# Patient Record
Sex: Female | Born: 2000 | Hispanic: Yes | Marital: Single | Smoking: Never smoker
Health system: Southern US, Community
[De-identification: ages and names within clinical notes are randomized; demographics above are authoritative.]

## PROBLEM LIST (undated history)

## (undated) DIAGNOSIS — K219 Gastro-esophageal reflux disease without esophagitis: Secondary | ICD-10-CM

---
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
## 2020-02-08 ENCOUNTER — Ambulatory Visit (INDEPENDENT_AMBULATORY_CARE_PROVIDER_SITE_OTHER): Payer: Medicaid Other | Admitting: Nurse Practitioner

## 2020-02-08 ENCOUNTER — Other Ambulatory Visit: Payer: Self-pay

## 2020-02-08 ENCOUNTER — Encounter: Payer: Self-pay | Admitting: Nurse Practitioner

## 2020-02-08 VITALS — BP 96/60 | HR 73 | Temp 98.1°F | Resp 18 | Wt 108.0 lb

## 2020-02-08 DIAGNOSIS — Z309 Encounter for contraceptive management, unspecified: Secondary | ICD-10-CM

## 2020-02-08 MED ORDER — MEDROXYPROGESTERONE ACETATE 150 MG/ML IM SUSP: 150.0000 mg | Freq: Once | INTRAMUSCULAR | 0 refills | Status: DC

## 2020-02-08 NOTE — Progress Notes (Signed)
Established Patient Office Visit  Subjective:  Patient ID: Kathleen Miles, female    DOB: 2001/04/20  Age: 19 y.o. MRN: [id number]  CC:  Chief Complaint  Patient presents with  . Contraception    HPI Kathleen Miles is a 19 year old female presenting to discuss birth control. She desires to start Depo Provera shots. Was on birth control however she stopped taking. She did have unprotected sex today.   History reviewed. No pertinent past medical history.  History reviewed. No pertinent surgical history.  Family History  Problem Relation Age of Onset  . HIV Father     Social History   Socioeconomic History  . Marital status: Single    Spouse name: Not on file  . Number of children: Not on file  . Years of education: Not on file  . Highest education level: Not on file  Occupational History  . Not on file  Tobacco Use  . Smoking status: Never Smoker  . Smokeless tobacco: Never Used  Substance and Sexual Activity  . Alcohol use: No  . Drug use: No  . Sexual activity: Never  Other Topics Concern  . Not on file  Social History Narrative   Lives with mother and younger brother Charlcie Cradle.  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  Wants to play basketball and run track.   Social Determinants of Health   Financial Resource Strain:   . Difficulty of Paying Living Expenses:   Food Insecurity:   . Worried About Programme researcher, broadcasting/film/video in the Last Year:   . Barista in the Last Year:   Transportation Needs:   . Freight forwarder (Medical):   Marland Kitchen Lack of Transportation (Non-Medical):   Physical Activity:   . Days of Exercise per Week:   . Minutes of Exercise per Session:   Stress:   . Feeling of Stress :   Social Connections:   . Frequency of Communication with Friends and Family:   . Frequency of Social Gatherings with Friends and Family:   . Attends Religious Services:   . Active Member of Clubs or Organizations:   . Attends Banker Meetings:   Marland Kitchen  Marital Status:   Intimate Partner Violence:   . Fear of Current or Ex-Partner:   . Emotionally Abused:   Marland Kitchen Physically Abused:   . Sexually Abused:     Outpatient Medications Prior to Visit  Medication Sig Dispense Refill  . adapalene (DIFFERIN) 0.1 % cream APLIQUE AL AREA AFECTADA TODOS LOS DIAS AL ACOSTARSE 45 g 3  . doxycycline (VIBRAMYCIN) 50 MG capsule TOME UNA CAPSULA DOS VECES AL DIA (Patient not taking: Reported on 06/17/2018) 60 capsule 3  . medroxyPROGESTERone (PROVERA) 10 MG tablet Take 1 tablet (10 mg total) by mouth daily. (Patient not taking: Reported on 06/17/2018) 10 tablet 0  . norgestrel-ethinyl estradiol (LO/OVRAL,CRYSELLE) 0.3-30 MG-MCG tablet Take 1 tablet by mouth daily. (Patient not taking: Reported on 06/17/2018) 1 Package 11  . omeprazole (PRILOSEC) 20 MG capsule Take 1 capsule (20 mg total) by mouth daily. 30 capsule 3  . ondansetron (ZOFRAN ODT) 4 MG disintegrating tablet Take 1 tablet (4 mg total) by mouth every 8 (eight) hours as needed for nausea or vomiting. 20 tablet 0   No facility-administered medications prior to visit.    No Known Allergies  ROS Review of Systems  All other systems reviewed and are negative.     Objective:    Physical Exam Vitals reviewed.  Constitutional:  Appearance: Normal appearance.  HENT:     Head: Normocephalic.  Eyes:     Extraocular Movements: Extraocular movements intact.     Conjunctiva/sclera: Conjunctivae normal.     Pupils: Pupils are equal, round, and reactive to light.  Cardiovascular:     Rate and Rhythm: Normal rate.  Pulmonary:     Effort: Pulmonary effort is normal.  Musculoskeletal:        General: Normal range of motion.     Cervical back: Normal range of motion and neck supple.     Right lower leg: No edema.     Left lower leg: No edema.  Skin:    General: Skin is warm.  Neurological:     General: No focal deficit present.     Mental Status: She is alert and oriented to person, place,  and time.     GCS: GCS eye subscore is 4. GCS verbal subscore is 5. GCS motor subscore is 6.  Psychiatric:        Mood and Affect: Mood normal.        Thought Content: Thought content normal.     BP 96/60 (BP Location: Left Arm, Patient Position: Sitting, Cuff Size: Normal)   Pulse 73   Temp 98.1 F (36.7 C) (Temporal)   Resp 18   Wt 108 lb (49 kg)   LMP 01/13/2020   SpO2 96%  Wt Readings from Last 3 Encounters:  02/08/20 108 lb (49 kg) (13 %, Z= -1.14)*  06/17/18 114 lb 2 oz (51.8 kg) (31 %, Z= -0.51)*  04/23/17 114 lb 3.2 oz (51.8 kg) (37 %, Z= -0.33)*   * Growth percentiles are based on CDC (Girls, 2-20 Years) data.     Health Maintenance Due  Topic Date Due  . Hepatitis C Screening  Never done  . HIV Screening  Never done  . TETANUS/TDAP  Never done    There are no preventive care reminders to display for this patient.  No results found for: TSH Lab Results  Component Value Date   WBC 5.9 06/17/2018   HGB 13.9 06/17/2018   HCT 40.4 06/17/2018   MCV 91.8 06/17/2018   PLT 219 06/17/2018   Lab Results  Component Value Date   NA 137 06/17/2018   K 4.6 06/17/2018   CO2 28 06/17/2018   GLUCOSE 74 06/17/2018   BUN 12 06/17/2018   CREATININE 0.61 06/17/2018   BILITOT 0.4 06/17/2018   AST 16 06/17/2018   ALT 8 06/17/2018   PROT 7.6 06/17/2018   CALCIUM 9.8 06/17/2018   No results found for: CHOL No results found for: HDL No results found for: LDLCALC No results found for: TRIG No results found for: CHOLHDL No results found for: BJSE8B    Assessment & Plan:   Problem List Items Addressed This Visit      Other   Encounter for contraceptive management - Primary   Relevant Medications   medroxyPROGESTERone (DEPO-PROVERA) 150 MG/ML injection   Other Relevant Orders   Pregnancy, urine     Pt will pick up depo RX. She is due for menstrual cycle in 4 days. Once she starts she will bring the Depo over 7 days of menstrual onset to the clinic for pregnancy  test and if negative administration performed by clinic CMA. Pt instructed to use a back up method of birth control such as abstinence, spermicide, condom sponge for the first month. She verbalized and understanding of the directions. Side effects, risk versus benefit  of this method discussed and pt wishes to proceed.   Meds ordered this encounter  Medications  . medroxyPROGESTERone (DEPO-PROVERA) 150 MG/ML injection    Sig: Inject 1 mL (150 mg total) into the muscle once for 1 dose. Pt to bring to clinic to be administered by nurse at [facility]    Dispense:  1 mL    Refill:  0    Follow-up: Return in about 1 week (around 02/15/2020).    Elmore Guise, FNP

## 2020-02-16 ENCOUNTER — Other Ambulatory Visit: Payer: Self-pay

## 2020-02-16 ENCOUNTER — Ambulatory Visit (INDEPENDENT_AMBULATORY_CARE_PROVIDER_SITE_OTHER): Payer: Medicaid Other

## 2020-02-16 DIAGNOSIS — Z309 Encounter for contraceptive management, unspecified: Secondary | ICD-10-CM

## 2020-02-16 MED ORDER — MEDROXYPROGESTERONE ACETATE 150 MG/ML IM SUSP
150.0000 mg | Freq: Once | INTRAMUSCULAR | Status: AC
  Administered 2020-02-16: 150 mg via INTRAMUSCULAR

## 2020-05-02 ENCOUNTER — Other Ambulatory Visit: Payer: Self-pay

## 2020-05-02 DIAGNOSIS — Z309 Encounter for contraceptive management, unspecified: Secondary | ICD-10-CM

## 2020-05-02 MED ORDER — MEDROXYPROGESTERONE ACETATE 150 MG/ML IM SUSP: 150.0000 mg | Freq: Once | INTRAMUSCULAR | 2 refills | Status: DC

## 2020-05-10 ENCOUNTER — Ambulatory Visit (INDEPENDENT_AMBULATORY_CARE_PROVIDER_SITE_OTHER): Payer: Medicaid Other | Admitting: Family Medicine

## 2020-05-10 ENCOUNTER — Other Ambulatory Visit: Payer: Self-pay

## 2020-05-10 VITALS — BP 124/70 | HR 73 | Temp 97.7°F | Ht 59.0 in | Wt 103.0 lb

## 2020-05-10 DIAGNOSIS — R300 Dysuria: Secondary | ICD-10-CM

## 2020-05-10 DIAGNOSIS — N898 Other specified noninflammatory disorders of vagina: Secondary | ICD-10-CM

## 2020-05-10 MED ORDER — SULFAMETHOXAZOLE-TRIMETHOPRIM 800-160 MG PO TABS: 1.0000 | ORAL_TABLET | Freq: Two times a day (BID) | ORAL | 0 refills | Status: DC

## 2020-05-10 NOTE — Progress Notes (Signed)
Subjective:    Patient ID: Kathleen Miles, female    DOB: April 22, 2001, 19 y.o.   MRN: [id number]  HPI  Patient is a very pleasant 19 year old Hispanic female who presents today with symptoms of a urinary tract infection.  She reports 1 week history of dysuria.  It burns every time she has to urinate.  She also reports increased frequency and urgency.  However sometimes she feels like she has to urinate and nothing comes out.  She reports hesitancy.  When she finishes urinating, and she stands up, she feels like she has to urinate again.  Symptoms sound classic for a bladder infection.  However she does have a new sexual partner.  They are in a monogamous relationship for the last month.  She is concerned about possible STD exposure and would like to have GC chlamydia testing. No past medical history on file. No past surgical history on file. Current Outpatient Medications on File Prior to Visit  Medication Sig Dispense Refill  . medroxyPROGESTERone (DEPO-PROVERA) 150 MG/ML injection Inject 1 mL (150 mg total) into the muscle once for 1 dose. Pt to bring to clinic to be administered by nurse at [facility] 1 mL 2   No current facility-administered medications on file prior to visit.   No Known Allergies Social History   Socioeconomic History  . Marital status: Single    Spouse name: Not on file  . Number of children: Not on file  . Years of education: Not on file  . Highest education level: Not on file  Occupational History  . Not on file  Tobacco Use  . Smoking status: Never Smoker  . Smokeless tobacco: Never Used  Substance and Sexual Activity  . Alcohol use: No  . Drug use: No  . Sexual activity: Never  Other Topics Concern  . Not on file  Social History Narrative   Lives with mother and younger brother Charlcie Cradle.  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  Wants to play basketball and run track.   Social Determinants of Health   Financial Resource Strain:   .  Difficulty of Paying Living Expenses: Not on file  Food Insecurity:   . Worried About Programme researcher, broadcasting/film/video in the Last Year: Not on file  . Ran Out of Food in the Last Year: Not on file  Transportation Needs:   . Lack of Transportation (Medical): Not on file  . Lack of Transportation (Non-Medical): Not on file  Physical Activity:   . Days of Exercise per Week: Not on file  . Minutes of Exercise per Session: Not on file  Stress:   . Feeling of Stress : Not on file  Social Connections:   . Frequency of Communication with Friends and Family: Not on file  . Frequency of Social Gatherings with Friends and Family: Not on file  . Attends Religious Services: Not on file  . Active Member of Clubs or Organizations: Not on file  . Attends Banker Meetings: Not on file  . Marital Status: Not on file  Intimate Partner Violence:   . Fear of Current or Ex-Partner: Not on file  . Emotionally Abused: Not on file  . Physically Abused: Not on file  . Sexually Abused: Not on file     Review of Systems  All other systems reviewed and are negative.      Objective:   Physical Exam Vitals reviewed. Exam conducted with a chaperone present.  Constitutional:  General: She is not in acute distress.    Appearance: Normal appearance. She is not ill-appearing or toxic-appearing.  Cardiovascular:     Heart sounds: Normal heart sounds.  Pulmonary:     Breath sounds: Normal breath sounds.  Abdominal:     General: Bowel sounds are normal. There is no distension.     Palpations: Abdomen is soft.     Tenderness: There is no abdominal tenderness. There is no guarding.  Genitourinary:    General: Normal vulva.     Vagina: Normal.     Cervix: No cervical motion tenderness, discharge or erythema.     Uterus: Normal.   Neurological:     Mental Status: She is alert.           Assessment & Plan:  Dysuria - Plan: Urinalysis, Routine w reflex microscopic, WET PREP FOR TRICH, YEAST,  CLUE, C. trachomatis/N. gonorrhoeae RNA  Discharge from the vagina - Plan: C. trachomatis/N. gonorrhoeae RNA  Patient request STD testing given her new sexual partner.  Therefore gonorrhea and chlamydia swabs were sent along with a wet prep.  However urinalysis shows +2 blood, +2 protein, negative nitrites, +2 leukocyte esterase.  Suggest urinary tract infection which I will treat with Bactrim double strength tablets twice daily for 5 days.

## 2020-05-11 LAB — URINALYSIS, ROUTINE W REFLEX MICROSCOPIC
Bilirubin Urine: NEGATIVE
Glucose, UA: NEGATIVE
Hyaline Cast: NONE SEEN /LPF
Nitrite: NEGATIVE
Specific Gravity, Urine: 1.025 (ref 1.001–1.03)
pH: 7 (ref 5.0–8.0)

## 2020-05-11 LAB — C. TRACHOMATIS/N. GONORRHOEAE RNA
C. trachomatis RNA, TMA: NOT DETECTED
N. gonorrhoeae RNA, TMA: NOT DETECTED

## 2020-05-11 LAB — MICROSCOPIC MESSAGE

## 2020-05-16 ENCOUNTER — Telehealth: Payer: Self-pay | Admitting: Family Medicine

## 2020-05-16 NOTE — Telephone Encounter (Signed)
Call for lab results # [phone]

## 2020-05-17 NOTE — Telephone Encounter (Signed)
Spoke with Pt about her results

## 2020-05-31 ENCOUNTER — Other Ambulatory Visit: Payer: Self-pay

## 2020-05-31 ENCOUNTER — Ambulatory Visit (INDEPENDENT_AMBULATORY_CARE_PROVIDER_SITE_OTHER): Payer: Medicaid Other | Admitting: Family Medicine

## 2020-05-31 VITALS — BP 110/80 | HR 63 | Temp 98.1°F | Ht 60.0 in | Wt 105.0 lb

## 2020-05-31 DIAGNOSIS — R634 Abnormal weight loss: Secondary | ICD-10-CM

## 2020-05-31 DIAGNOSIS — N920 Excessive and frequent menstruation with regular cycle: Secondary | ICD-10-CM

## 2020-05-31 MED ORDER — NORETHINDRONE ACET-ETHINYL EST 1.5-30 MG-MCG PO TABS: 1.0000 | ORAL_TABLET | Freq: Every day | ORAL | 11 refills | Status: DC

## 2020-05-31 NOTE — Progress Notes (Signed)
Subjective:    Patient ID: Kathleen Miles, female    DOB: 08-Apr-2001, 19 y.o.   MRN: [id number]  HPI 05/10/20 Patient is a very pleasant 19 year old Hispanic female who presents today with symptoms of a urinary tract infection.  She reports 1 week history of dysuria.  It burns every time she has to urinate.  She also reports increased frequency and urgency.  However sometimes she feels like she has to urinate and nothing comes out.  She reports hesitancy.  When she finishes urinating, and she stands up, she feels like she has to urinate again.  Symptoms sound classic for a bladder infection.  However she does have a new sexual partner.  They are in a monogamous relationship for the last month.  She is concerned about possible STD exposure and would like to have GC chlamydia testing.  At that time, my plan was: Patient request STD testing given her new sexual partner.  Therefore gonorrhea and chlamydia swabs were sent along with a wet prep.  However urinalysis shows +2 blood, +2 protein, negative nitrites, +2 leukocyte esterase.  Suggest urinary tract infection which I will treat with Bactrim double strength tablets twice daily for 5 days.  Office Visit on 05/10/2020  Component Date Value Ref Range Status  . Color, Urine 05/10/2020 YELLOW  YELLOW Final  . APPearance 05/10/2020 CLOUDY* CLEAR Final  . Specific Gravity, Urine 05/10/2020 1.025  1.001 - 1.03 Final  . pH 05/10/2020 7.0  5.0 - 8.0 Final  . Glucose, UA 05/10/2020 NEGATIVE  NEGATIVE Final  . Bilirubin Urine 05/10/2020 NEGATIVE  NEGATIVE Final  . Ketones, ur 05/10/2020 TRACE* NEGATIVE Final  . Hgb urine dipstick 05/10/2020 2+* NEGATIVE Final  . Protein, ur 05/10/2020 2+* NEGATIVE Final  . Nitrite 05/10/2020 NEGATIVE  NEGATIVE Final  . Leukocytes,Ua 05/10/2020 2+* NEGATIVE Final  . WBC, UA 05/10/2020 10-20* 0 - 5 /HPF Final  . RBC / HPF 05/10/2020 3-10* 0 - 2 /HPF Final  . Squamous Epithelial / LPF 05/10/2020 0-5  < OR = 5 /HPF  Final  . Bacteria, UA 05/10/2020 FEW* NONE SEEN /HPF Final  . Hyaline Cast 05/10/2020 NONE SEEN  NONE SEEN /LPF Final  . C. trachomatis RNA, TMA 05/10/2020 NOT DETECTED  NOT DETECT Final  . N. gonorrhoeae RNA, TMA 05/10/2020 NOT DETECTED  NOT DETECT Final   Comment: The analytical performance characteristics of this assay, when used to test SurePath(TM) specimens have been determined by [employer]. The modifications have not been cleared or approved by the FDA. This assay has been validated pursuant to the CLIA regulations and is used for clinical purposes. . For additional information, please refer to https://education.[employer].com/faq/FAQ154 (This link is being provided for information/ educational purposes only.) .   Marland Kitchen Note 05/10/2020    Final   Comment: This urine was analyzed for the presence of WBC,  RBC, bacteria, casts, and other formed elements.  Only those elements seen were reported. . .    05/31/20 Patient is a very pleasant 19 year old Hispanic female who presents today to discuss birth control options.  In the past she was on Depo-Provera however she only received 1 injection.  She never came back for the repeat injection 3 months later.  She states that the medication gave her headaches and made her feel bad and she was having irregular bleeding so she wanted to stop it.  She is now requesting something new for birth control.  She states that her periods are regular but they  can be very heavy.  For instance her last menstrual cycle lasted from October 18 until yesterday.  Therefore it was almost 2 weeks.  She also reports unintentional weight loss.  She states that she was previously 120 pounds and she is down to 105 pounds over the last several months.  She attributes this to her new job.  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  She denies any fevers or chills.  She denies any palpitations or  irregular heartbeat.  She denies any polyuria, polydipsia, or blurry vision. No past medical history on file. No past surgical history on file. Current Outpatient Medications on File Prior to Visit  Medication Sig Dispense Refill  . medroxyPROGESTERone (DEPO-PROVERA) 150 MG/ML injection Inject 1 mL (150 mg total) into the muscle once for 1 dose. Pt to bring to clinic to be administered by nurse at [facility] 1 mL 2  . sulfamethoxazole-trimethoprim (BACTRIM DS) 800-160 MG tablet Take 1 tablet by mouth 2 (two) times daily. 10 tablet 0   No current facility-administered medications on file prior to visit.   No Known Allergies Social History   Socioeconomic History  . Marital status: Single    Spouse name: Not on file  . Number of children: Not on file  . Years of education: Not on file  . Highest education level: Not on file  Occupational History  . Not on file  Tobacco Use  . Smoking status: Never Smoker  . Smokeless tobacco: Never Used  Substance and Sexual Activity  . Alcohol use: No  . Drug use: No  . Sexual activity: Never  Other Topics Concern  . Not on file  Social History Narrative   Lives with mother and younger brother Charlcie Cradle.  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  Wants to play basketball and run track.   Social Determinants of Health   Financial Resource Strain:   . Difficulty of Paying Living Expenses: Not on file  Food Insecurity:   . Worried About Programme researcher, broadcasting/film/video in the Last Year: Not on file  . Ran Out of Food in the Last Year: Not on file  Transportation Needs:   . Lack of Transportation (Medical): Not on file  . Lack of Transportation (Non-Medical): Not on file  Physical Activity:   . Days of Exercise per Week: Not on file  . Minutes of Exercise per Session: Not on file  Stress:   . Feeling of Stress : Not on file  Social Connections:   . Frequency of Communication with Friends and Family: Not on file  . Frequency of Social Gatherings with  Friends and Family: Not on file  . Attends Religious Services: Not on file  . Active Member of Clubs or Organizations: Not on file  . Attends Banker Meetings: Not on file  . Marital Status: Not on file  Intimate Partner Violence:   . Fear of Current or Ex-Partner: Not on file  . Emotionally Abused: Not on file  . Physically Abused: Not on file  . Sexually Abused: Not on file     Review of Systems  All other systems reviewed and are negative.      Objective:   Physical Exam Vitals reviewed.  Constitutional:      General: She is not in acute distress.    Appearance: Normal appearance. She is not ill-appearing or toxic-appearing.  Cardiovascular:     Heart  sounds: Normal heart sounds.  Pulmonary:     Breath sounds: Normal breath sounds.  Abdominal:     General: Bowel sounds are normal. There is no distension.     Palpations: Abdomen is soft.     Tenderness: There is no abdominal tenderness. There is no guarding.  Neurological:     Mental Status: She is alert.           Assessment & Plan:  Weight loss, abnormal - Plan: CBC with Differential/Platelet, COMPLETE METABOLIC PANEL WITH GFR, TSH, hCG, serum, qualitative  Menorrhagia with regular cycle - Plan: hCG, serum, qualitative   Given the abnormal weight loss I would like to start by obtaining baseline lab work including a CBC, CMP, TSH and beta-hCG particularly given the irregular bleeding.  If lab work is normal, I would recommend a protein supplement such as Ensure or boost in an effort to increase her calorie intake to match her calorie expenditure.  Also want to check a CBC to evaluate for any significant anemia due to her menorrhagia.  Begin Loestrin 1.5/30 daily.  Since her period just stopped yesterday she can start the first cycle of pills today.

## 2020-06-01 LAB — CBC WITH DIFFERENTIAL/PLATELET
Absolute Monocytes: 271 cells/uL (ref 200–950)
Basophils Absolute: 39 cells/uL (ref 0–200)
Basophils Relative: 0.9 %
Eosinophils Absolute: 133 cells/uL (ref 15–500)
Eosinophils Relative: 3.1 %
HCT: 40.7 % (ref 35.0–45.0)
Hemoglobin: 13.9 g/dL (ref 11.7–15.5)
Lymphs Abs: 1737 cells/uL (ref 850–3900)
MCH: 32.7 pg (ref 27.0–33.0)
MCHC: 34.2 g/dL (ref 32.0–36.0)
MCV: 95.8 fL (ref 80.0–100.0)
MPV: 11.1 fL (ref 7.5–12.5)
Monocytes Relative: 6.3 %
Neutro Abs: 2120 cells/uL (ref 1500–7800)
Neutrophils Relative %: 49.3 %
Platelets: 204 10*3/uL (ref 140–400)
RBC: 4.25 10*6/uL (ref 3.80–5.10)
RDW: 11.9 % (ref 11.0–15.0)
Total Lymphocyte: 40.4 %
WBC: 4.3 10*3/uL (ref 3.8–10.8)

## 2020-06-01 LAB — COMPLETE METABOLIC PANEL WITH GFR
AG Ratio: 1.7 (calc) (ref 1.0–2.5)
ALT: 11 U/L (ref 5–32)
AST: 16 U/L (ref 12–32)
Albumin: 4.5 g/dL (ref 3.6–5.1)
Alkaline phosphatase (APISO): 48 U/L (ref 36–128)
BUN: 10 mg/dL (ref 7–20)
CO2: 22 mmol/L (ref 20–32)
Calcium: 9.3 mg/dL (ref 8.9–10.4)
Chloride: 107 mmol/L (ref 98–110)
Creat: 0.62 mg/dL (ref 0.50–1.00)
GFR, Est African American: 152 mL/min/{1.73_m2} (ref >=60)
GFR, Est Non African American: 131 mL/min/{1.73_m2} (ref >=60)
Globulin: 2.6 g/dL (calc) (ref 2.0–3.8)
Glucose, Bld: 95 mg/dL (ref 65–99)
Potassium: 4.3 mmol/L (ref 3.8–5.1)
Sodium: 140 mmol/L (ref 135–146)
Total Bilirubin: 0.6 mg/dL (ref 0.2–1.1)
Total Protein: 7.1 g/dL (ref 6.3–8.2)

## 2020-06-01 LAB — TSH: TSH: 0.97 mIU/L

## 2020-06-01 LAB — HCG, SERUM, QUALITATIVE: Preg, Serum: NEGATIVE

## 2020-07-09 ENCOUNTER — Telehealth: Payer: Self-pay

## 2020-07-09 NOTE — Telephone Encounter (Signed)
If she has been taking new contraceptive consistently, could be breakthrough bleeding as her body is adjusting to the new contraceptive.  If she has not been taking it consistently and is worried about pregnancy, please have her come in and/or have her do a pregnancy test at home.  I have no problem seeing her and talking about this in-person with her if she would like.

## 2020-07-09 NOTE — Telephone Encounter (Signed)
Patient was given NP instructions, patient verbalized understanding and will give contraceptive another month to adjust.

## 2020-07-09 NOTE — Telephone Encounter (Signed)
Patient started new contraceptive prescription a month ago, she is now having a clear discharge with spotting, abdominal pain, cramping. Has been sexually active, will schedule patient appointment to be seen.

## 2020-07-10 ENCOUNTER — Ambulatory Visit: Payer: Self-pay | Admitting: Nurse Practitioner

## 2020-07-12 ENCOUNTER — Other Ambulatory Visit: Payer: Self-pay

## 2020-07-12 ENCOUNTER — Ambulatory Visit
Admission: EM | Admit: 2020-07-12 | Discharge: 2020-07-12 | Disposition: A | Discharge status: Home / Self Care | Payer: Medicaid Other | Attending: Internal Medicine | Admitting: Internal Medicine

## 2020-07-12 DIAGNOSIS — R300 Dysuria: Secondary | ICD-10-CM | POA: Insufficient documentation

## 2020-07-12 LAB — POCT URINALYSIS DIP (MANUAL ENTRY)
Bilirubin, UA: NEGATIVE
Glucose, UA: NEGATIVE mg/dL
Ketones, POC UA: NEGATIVE mg/dL
Nitrite, UA: NEGATIVE
Protein Ur, POC: NEGATIVE mg/dL
Spec Grav, UA: >=1.03 — AB (ref 1.010–1.025)
Urobilinogen, UA: 0.2 E.U./dL
pH, UA: 6 (ref 5.0–8.0)

## 2020-07-12 MED ORDER — PHENAZOPYRIDINE HCL 200 MG PO TABS: 200.0000 mg | ORAL_TABLET | Freq: Three times a day (TID) | ORAL | 0 refills | Status: DC

## 2020-07-12 NOTE — ED Provider Notes (Addendum)
[facility]    CSN: [id number] Arrival date & time: 07/12/20  0841      History   Chief Complaint Chief Complaint  Patient presents with  . Dysuria    HPI Kathleen Miles is a 19 y.o. female comes to the urgent care with complaints of dysuria over the past few days.  Patient says symptoms started a few days ago and has been persistent.  She has had some vaginal spotting as well.  She attributed to change in birth control medications.  No fever or chills.  No nausea vomiting.  No flank pain.  Patient is sexually active.  She denies dyspareunia both superficial and deep.  She endorses vaginal irritation during sexual intercourse.   HPI  History reviewed. No pertinent past medical history.  Patient Active Problem List   Diagnosis Date Noted  . Encounter for contraceptive management 02/08/2020  . Abnormal uterine bleeding 04/23/2017  . 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 No pertinent surgical history.  OB History   No obstetric history on file.      Home Medications    Prior to Admission medications   Medication Sig Start Date End Date Taking? Authorizing Provider  Norethindrone Acetate-Ethinyl Estradiol (LOESTRIN 1.5/30, 21,) 1.5-30 MG-MCG tablet Take 1 tablet by mouth daily. 05/31/20   Donita Brooks, MD  phenazopyridine (PYRIDIUM) 200 MG tablet Take 1 tablet (200 mg total) by mouth 3 (three) times daily. 07/12/20   LampteyBritta Mccreedy, MD    Family History Family History  Problem Relation Age of Onset  . HIV Father     Social History Social History   Tobacco Use  . Smoking status: Never Smoker  . Smokeless tobacco: Never Used  Substance Use Topics  . Alcohol use: No  . Drug use: No     Allergies   Patient has no known allergies.   Review of Systems Review of Systems  Respiratory: Negative.   Gastrointestinal: Negative for abdominal pain, diarrhea, nausea and vomiting.  Genitourinary: Positive for dysuria and  vaginal pain. Negative for frequency, urgency and vaginal discharge.     Physical Exam Triage Vital Signs ED Triage Vitals  Enc Vitals Group     BP 07/12/20 0914 106/69     Pulse Rate 07/12/20 0914 67     Resp 07/12/20 0914 20     Temp 07/12/20 0914 98.6 F (37 C)     Temp src --      SpO2 07/12/20 0914 98 %     Weight --      Height --      Head Circumference --      Peak Flow --      Pain Score 07/12/20 0912 3     Pain Loc --      Pain Edu? --      Excl. in GC? --    No data found.  Updated Vital Signs BP 106/69   Pulse 67   Temp 98.6 F (37 C)   Resp 20   LMP 06/21/2020 (Approximate)   SpO2 98%   Visual Acuity Right Eye Distance:   Left Eye Distance:   Bilateral Distance:    Right Eye Near:   Left Eye Near:    Bilateral Near:     Physical Exam Vitals and nursing note reviewed.  Cardiovascular:     Rate and Rhythm: Normal rate and regular rhythm.     Pulses: Normal pulses.     Heart sounds: Normal  heart sounds.  Abdominal:     General: Bowel sounds are normal.     Palpations: Abdomen is soft.  Skin:    Capillary Refill: Capillary refill takes less than 2 seconds.  Neurological:     Mental Status: She is alert.      UC Treatments / Results  Labs (all labs ordered are listed, but only abnormal results are displayed) Labs Reviewed  POCT URINALYSIS DIP (MANUAL ENTRY) - Abnormal; Notable for the following components:      Result Value   Clarity, UA cloudy (*)    Spec Grav, UA >=1.030 (*)    Blood, UA small (*)    Leukocytes, UA Trace (*)    All other components within normal limits  URINE CULTURE  CERVICOVAGINAL ANCILLARY ONLY    EKG   Radiology No results found.  Procedures Procedures (including critical care time)  Medications Ordered in UC Medications - No data to display  Initial Impression / Assessment and Plan / UC Course  I have reviewed the triage vital signs and the nursing notes.  Pertinent labs & imaging results that  were available during my care of the patient were reviewed by me and considered in my medical decision making (see chart for details).    1.  Dysuria: Point-of-care urinalysis is significant for specific gravity greater than 1.030, trace leukocyte esterase and a small amount of hemoglobin Patient is encouraged to increase oral fluid intake Urine cultures will be sent If cultures are significant patient will be started on antibiotics. Return precautions given Lubrication use during sexual intercourse advised. Cervical vaginal swab for GC/chlamydia/trichomonas. Pyridium 200 mg 3 times a day for 2 days sent to the pharmacy. Final Clinical Impressions(s) / UC Diagnoses   Final diagnoses:  Dysuria     Discharge Instructions     Increase oral fluid intake We will call you with recommendations if your labs are abnormal Your urine is very concentrated and that may be the reason why you are having difficulty urinating Return if your symptoms worsen.   ED Prescriptions    Medication Sig Dispense Auth. Provider   phenazopyridine (PYRIDIUM) 200 MG tablet Take 1 tablet (200 mg total) by mouth 3 (three) times daily. 6 tablet Nillie Bartolotta, Britta Mccreedy, MD     PDMP not reviewed this encounter.   Merrilee Jansky, MD 07/12/20 1105    Merrilee Jansky, MD 07/12/20 1106

## 2020-07-12 NOTE — ED Triage Notes (Signed)
Presents with c/o burning with urination but unsure if because of vaginal irritation , symptoms for past few days

## 2020-07-12 NOTE — Discharge Instructions (Addendum)
Increase oral fluid intake We will call you with recommendations if your labs are abnormal Your urine is very concentrated and that may be the reason why you are having difficulty urinating Return if your symptoms worsen.

## 2020-07-13 LAB — URINE CULTURE

## 2020-07-13 LAB — CERVICOVAGINAL ANCILLARY ONLY
Bacterial Vaginitis (gardnerella): NEGATIVE
Candida Glabrata: NEGATIVE
Candida Vaginitis: NEGATIVE
Chlamydia: NEGATIVE
Comment: NEGATIVE
Comment: NEGATIVE
Comment: NEGATIVE
Comment: NEGATIVE
Comment: NEGATIVE
Comment: NORMAL
Neisseria Gonorrhea: NEGATIVE
Trichomonas: NEGATIVE

## 2020-09-20 ENCOUNTER — Telehealth: Payer: Self-pay | Admitting: Family Medicine

## 2020-09-20 DIAGNOSIS — Z0100 Encounter for examination of eyes and vision without abnormal findings: Secondary | ICD-10-CM

## 2020-09-20 NOTE — Telephone Encounter (Signed)
Referral orders placed

## 2020-09-20 NOTE — Telephone Encounter (Signed)
ok 

## 2020-09-20 NOTE — Addendum Note (Signed)
Addended by: Phillips Odor on: 09/20/2020 04:39 PM   Modules accepted: Orders

## 2020-09-20 NOTE — Telephone Encounter (Signed)
Pt try to schedule Optometrist appointment was told need a referral

## 2020-09-20 NOTE — Telephone Encounter (Signed)
Ok to place referral.

## 2020-11-12 ENCOUNTER — Encounter: Payer: Self-pay | Admitting: Emergency Medicine

## 2020-11-12 ENCOUNTER — Ambulatory Visit
Admission: EM | Admit: 2020-11-12 | Discharge: 2020-11-12 | Disposition: A | Discharge status: Home / Self Care | Payer: Medicaid Other | Attending: Internal Medicine | Admitting: Internal Medicine

## 2020-11-12 ENCOUNTER — Other Ambulatory Visit: Payer: Self-pay

## 2020-11-12 DIAGNOSIS — N739 Female pelvic inflammatory disease, unspecified: Secondary | ICD-10-CM | POA: Diagnosis not present

## 2020-11-12 DIAGNOSIS — R3129 Other microscopic hematuria: Secondary | ICD-10-CM | POA: Insufficient documentation

## 2020-11-12 LAB — POCT URINALYSIS DIP (MANUAL ENTRY)
Bilirubin, UA: NEGATIVE
Glucose, UA: NEGATIVE mg/dL
Ketones, POC UA: NEGATIVE mg/dL
Leukocytes, UA: NEGATIVE
Nitrite, UA: NEGATIVE
Protein Ur, POC: NEGATIVE mg/dL
Spec Grav, UA: >=1.03 — AB (ref 1.010–1.025)
Urobilinogen, UA: 0.2 E.U./dL
pH, UA: 5.5 (ref 5.0–8.0)

## 2020-11-12 MED ORDER — CEFTRIAXONE SODIUM 1 G IJ SOLR
0.5000 g | Freq: Once | INTRAMUSCULAR | Status: AC
  Administered 2020-11-12: 0.5 g via INTRAMUSCULAR

## 2020-11-12 MED ORDER — DOXYCYCLINE HYCLATE 100 MG PO CAPS: 100.0000 mg | ORAL_CAPSULE | Freq: Two times a day (BID) | ORAL | 0 refills | Status: DC

## 2020-11-12 MED ORDER — METRONIDAZOLE 500 MG PO TABS: 500.0000 mg | ORAL_TABLET | Freq: Two times a day (BID) | ORAL | 0 refills | Status: DC

## 2020-11-12 MED ORDER — FLUCONAZOLE 150 MG PO TABS: 150.0000 mg | ORAL_TABLET | Freq: Every day | ORAL | 0 refills | Status: DC

## 2020-11-12 NOTE — ED Provider Notes (Signed)
[facility]    CSN: [id number] Arrival date & time: 11/12/20  1110      History   Chief Complaint No chief complaint on file.   HPI Kathleen Miles is a 20 y.o. female who presents with lower abdominal pain x 2 days. Is sexually active and in a monogamous relationship x 1 years. She was having a chunky vaginal discharge and used 3 days monostat cream and her symptoms resolved, but her boyfriend has a penile rash that looks like dry skin. She has continued having suprapubic pain without dysuria. Would like STD testing, but declines blood work. Denies having a fever.  She is on OC's.     History reviewed. No pertinent past medical history.  Patient Active Problem List   Diagnosis Date Noted  . Encounter for contraceptive management 02/08/2020  . Abnormal uterine bleeding 04/23/2017  . 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 No pertinent surgical history.  OB History   No obstetric history on file.      Home Medications    Prior to Admission medications   Medication Sig Start Date End Date Taking? Authorizing Provider  doxycycline (VIBRAMYCIN) 100 MG capsule Take 1 capsule (100 mg total) by mouth 2 (two) times daily. 11/12/20  Yes Rodriguez-Southworth, Nettie Elm, PA-C  fluconazole (DIFLUCAN) 150 MG tablet Take 1 tablet (150 mg total) by mouth daily. 11/12/20  Yes Rodriguez-Southworth, Nettie Elm, PA-C  metroNIDAZOLE (FLAGYL) 500 MG tablet Take 1 tablet (500 mg total) by mouth 2 (two) times daily. 11/12/20  Yes Rodriguez-Southworth, Nettie Elm, PA-C  Norethindrone Acetate-Ethinyl Estradiol (LOESTRIN 1.5/30, 21,) 1.5-30 MG-MCG tablet Take 1 tablet by mouth daily. 05/31/20   Donita Brooks, MD    Family History Family History  Problem Relation Age of Onset  . HIV Father     Social History Social History   Tobacco Use  . Smoking status: Never Smoker  . Smokeless tobacco: Never Used  Substance Use Topics  . Alcohol use: No  . Drug use: No      Allergies   Patient has no known allergies.   Review of Systems Review of Systems  Constitutional: Negative for appetite change, chills and fever.  Respiratory: Negative for cough.   Gastrointestinal: Positive for abdominal pain. Negative for abdominal distention, constipation, diarrhea, nausea and vomiting.  Genitourinary: Positive for pelvic pain and vaginal discharge. Negative for difficulty urinating, dyspareunia, dysuria, flank pain, frequency, genital sores and menstrual problem.  Skin: Negative for rash.  Hematological: Negative for adenopathy.     Physical Exam Triage Vital Signs ED Triage Vitals  Enc Vitals Group     BP 11/12/20 1124 117/76     Pulse Rate 11/12/20 1124 75     Resp 11/12/20 1124 18     Temp 11/12/20 1124 98 F (36.7 C)     Temp Source 11/12/20 1124 Oral     SpO2 11/12/20 1124 98 %     Weight --      Height --      Head Circumference --      Peak Flow --      Pain Score 11/12/20 1125 6     Pain Loc --      Pain Edu? --      Excl. in GC? --    No data found.  Updated Vital Signs BP 117/76 (BP Location: Right Arm)   Pulse 75   Temp 98 F (36.7 C) (Oral)   Resp 18   LMP 10/24/2020  SpO2 98%   Visual Acuity Right Eye Distance:   Left Eye Distance:   Bilateral Distance:    Right Eye Near:   Left Eye Near:    Bilateral Near:     Physical Exam Vitals and nursing note reviewed.  Constitutional:      General: She is not in acute distress.    Appearance: She is normal weight. She is not ill-appearing.  HENT:     Head: Normocephalic.     Right Ear: External ear normal.     Left Ear: External ear normal.  Eyes:     General: No scleral icterus.    Conjunctiva/sclera: Conjunctivae normal.  Pulmonary:     Effort: Pulmonary effort is normal.  Abdominal:     General: Abdomen is flat. Bowel sounds are normal.     Palpations: Abdomen is soft. There is no mass.     Tenderness: There is abdominal tenderness. There is no guarding or  rebound.     Comments: Is moderateley tender on suprapubic region  Genitourinary:    General: Normal vulva.     Comments: Has mild pain with uterine motion, which reproduced her pain, but  no adnexa fullness noted.  Musculoskeletal:        General: Normal range of motion.     Cervical back: Neck supple.  Skin:    General: Skin is warm and dry.  Neurological:     Mental Status: She is alert and oriented to person, place, and time.     Gait: Gait normal.  Psychiatric:        Mood and Affect: Mood normal.        Behavior: Behavior normal.        Thought Content: Thought content normal.        Judgment: Judgment normal.      UC Treatments / Results  Labs (all labs ordered are listed, but only abnormal results are displayed) Labs Reviewed  POCT URINALYSIS DIP (MANUAL ENTRY) - Abnormal; Notable for the following components:      Result Value   Clarity, UA hazy (*)    Spec Grav, UA >=1.030 (*)    Blood, UA moderate (*)    All other components within normal limits  URINE CULTURE  CERVICOVAGINAL ANCILLARY ONLY    EKG   Radiology No results found.  Procedures Procedures (including critical care time)  Medications Ordered in UC Medications  cefTRIAXone (ROCEPHIN) injection 0.5 g (0.5 g Intramuscular Given 11/12/20 1205)    Initial Impression / Assessment and Plan / UC Course  I have reviewed the triage vital signs and the nursing notes. Pertinent labs  results that were available during my care of the patient were reviewed by me and considered in my medical decision making (see chart for details). Candida resolved. Pelvic pain which may be early PID. She was given Rocephin 500 mg IM, I sent out Doxy and Flagyl as noted.  I sent a couple of diflucan pills in case she gets yeast again from the antibiotics.  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 The shot covers the gonorrhea.    I sent diflucan to cover  for yeast infection, take one if you develop yeast symptoms, and repeat a dose if not all the way better after finishing up the antibiotic.   I am also sending the urine for a culture to double check for infection     ED Prescriptions    Medication Sig Dispense Auth. Provider   doxycycline (VIBRAMYCIN) 100 MG capsule Take 1 capsule (100 mg total) by mouth 2 (two) times daily. 20 capsule Rodriguez-Southworth, Nettie Elm, PA-C   metroNIDAZOLE (FLAGYL) 500 MG tablet Take 1 tablet (500 mg total) by mouth 2 (two) times daily. 14 tablet Rodriguez-Southworth, Brier Reid, PA-C   fluconazole (DIFLUCAN) 150 MG tablet Take 1 tablet (150 mg total) by mouth daily. 2 tablet Rodriguez-Southworth, Nettie Elm, PA-C     PDMP not reviewed this encounter.   Garey Ham, [place] 11/12/20 2323

## 2020-11-12 NOTE — ED Triage Notes (Signed)
Lower abd pain and back pain x 2 days.  Vaginal discharge x 2 days

## 2020-11-12 NOTE — Discharge Instructions (Addendum)
It is possible that you have a mild uterus infection, and I will treat you with a couple of antibiotics to cover that, but this does not cover gonorrhea. The shot covers the gonorrhea.    I sent diflucan to cover for yeast infection, take one if you develop yeast symptoms, and repeat a dose if not all the way better after finishing up the antibiotic.   I am also sending the urine for a culture to double check for infection

## 2020-11-13 LAB — CERVICOVAGINAL ANCILLARY ONLY
Bacterial Vaginitis (gardnerella): NEGATIVE
Candida Glabrata: NEGATIVE
Candida Vaginitis: POSITIVE — AB
Chlamydia: NEGATIVE
Comment: NEGATIVE
Comment: NEGATIVE
Comment: NEGATIVE
Comment: NEGATIVE
Comment: NEGATIVE
Comment: NORMAL
Neisseria Gonorrhea: NEGATIVE
Trichomonas: NEGATIVE

## 2020-11-14 ENCOUNTER — Telehealth (HOSPITAL_COMMUNITY): Payer: Self-pay | Admitting: Emergency Medicine

## 2020-11-14 ENCOUNTER — Encounter: Payer: Self-pay | Admitting: Family Medicine

## 2020-11-14 LAB — URINE CULTURE

## 2020-11-16 ENCOUNTER — Ambulatory Visit (INDEPENDENT_AMBULATORY_CARE_PROVIDER_SITE_OTHER): Payer: Medicaid Other | Admitting: Family Medicine

## 2020-11-16 ENCOUNTER — Other Ambulatory Visit: Payer: Self-pay

## 2020-11-16 ENCOUNTER — Encounter: Payer: Self-pay | Admitting: Family Medicine

## 2020-11-16 VITALS — BP 112/68 | HR 82 | Temp 98.1°F | Resp 16 | Ht 60.0 in | Wt 110.8 lb

## 2020-11-16 DIAGNOSIS — R102 Pelvic and perineal pain: Secondary | ICD-10-CM

## 2020-11-16 DIAGNOSIS — R1031 Right lower quadrant pain: Secondary | ICD-10-CM | POA: Diagnosis not present

## 2020-11-16 DIAGNOSIS — R319 Hematuria, unspecified: Secondary | ICD-10-CM

## 2020-11-16 LAB — URINALYSIS, ROUTINE W REFLEX MICROSCOPIC
Bilirubin Urine: NEGATIVE
Glucose, UA: NEGATIVE
Hyaline Cast: NONE SEEN /LPF
Ketones, ur: NEGATIVE
Leukocytes,Ua: NEGATIVE
Nitrite: NEGATIVE
Protein, ur: NEGATIVE
Specific Gravity, Urine: 1.02 (ref 1.001–1.035)
WBC, UA: NONE SEEN /HPF (ref 0–5)
pH: 7 (ref 5.0–8.0)

## 2020-11-16 LAB — PREGNANCY, URINE: Preg Test, Ur: NEGATIVE

## 2020-11-16 LAB — MICROSCOPIC MESSAGE

## 2020-11-16 MED ORDER — TAMSULOSIN HCL 0.4 MG PO CAPS: 0.4000 mg | ORAL_CAPSULE | Freq: Every day | ORAL | 0 refills | Status: DC

## 2020-11-16 MED ORDER — HYDROCODONE-ACETAMINOPHEN 5-325 MG PO TABS: 1.0000 | ORAL_TABLET | ORAL | 0 refills | Status: DC | PRN

## 2020-11-16 NOTE — Progress Notes (Signed)
Subjective:    Patient ID: Kathleen Miles, female    DOB: Dec 21, 2000, 20 y.o.   MRN: [id number]  HPI Patient was recently seen at an urgent care for the same symptoms.  Urinalysis showed blood.  She also was found to have a yeast infection.  Gonorrhea and Chlamydia testing were negative.  She was negative for trichomoniasis.  She is here today for follow-up.  Was treated for possible pid and yeast.  Admission on 11/12/2020, Discharged on 11/12/2020  Component Date Value Ref Range Status  . Color, UA 11/12/2020 yellow  yellow Final  . Clarity, UA 11/12/2020 hazy* clear Final  . Glucose, UA 11/12/2020 negative  negative mg/dL Final  . Bilirubin, UA 11/12/2020 negative  negative Final  . Ketones, POC UA 11/12/2020 negative  negative mg/dL Final  . Spec Grav, UA 11/12/2020 >=1.030* 1.010 - 1.025 Final  . Blood, UA 11/12/2020 moderate* negative Final  . pH, UA 11/12/2020 5.5  5.0 - 8.0 Final  . Protein Ur, POC 11/12/2020 negative  negative mg/dL Final  . Urobilinogen, UA 11/12/2020 0.2  0.2 or 1.0 E.U./dL Final  . Nitrite, UA 60/63/0160 Negative  Negative Final  . Leukocytes, UA 11/12/2020 Negative  Negative Final  . Neisseria Gonorrhea 11/12/2020 Negative   Final  . Chlamydia 11/12/2020 Negative   Final  . Trichomonas 11/12/2020 Negative   Final  . Bacterial Vaginitis (gardnerella) 11/12/2020 Negative   Final  . Candida Vaginitis 11/12/2020 Positive*  Final  . Candida Glabrata 11/12/2020 Negative   Final  . Comment 11/12/2020 Normal Reference Range Bacterial Vaginosis - Negative   Final  . Comment 11/12/2020 Normal Reference Range Candida Species - Negative   Final  . Comment 11/12/2020 Normal Reference Range Candida Galbrata - Negative   Final  . Comment 11/12/2020 Normal Reference Range Trichomonas - Negative   Final  . Comment 11/12/2020 Normal Reference Ranger Chlamydia - Negative   Final  . Comment 11/12/2020 Normal Reference Range Neisseria Gonorrhea - Negative   Final  .  Specimen Description 11/12/2020    Final                   Value:URINE, CLEAN CATCH Performed at [facility], [address], [place], [place] [address]   . Special Requests 11/12/2020 NONE   Final  . Culture 11/12/2020    Final                   Value:CELL COUNT CONTROL LEVEL 2 GROUP B STREP(S.AGALACTIAE)ISOLATED TESTING AGAINST S. AGALACTIAE NOT ROUTINELY PERFORMED DUE TO PREDICTABILITY OF AMP/PEN/VAN SUSCEPTIBILITY. Performed at [facility], [address] [place], [place] [address]   . Report Status 11/12/2020 11/14/2020 FINAL   Final   Patient states that she has been in pain almost a week.  The pain begins in her lower back and then radiates to her right pelvis and right lower quadrant area.  The pain comes and goes in waves.  She also reports gross hematuria.  She denies any dysuria.  Urinalysis today again shows +2 blood and is otherwise clear.  Pregnancy test is negative. No past medical history on file. No past surgical history on file. Current Outpatient Medications on File Prior to Visit  Medication Sig Dispense Refill  . doxycycline (VIBRAMYCIN) 100 MG capsule Take 1 capsule (100 mg total) by mouth 2 (two) times daily. 20 capsule 0  . metroNIDAZOLE (FLAGYL) 500 MG tablet Take 1 tablet (500 mg total) by mouth 2 (two) times daily.  14 tablet 0  . Norethindrone Acetate-Ethinyl Estradiol (LOESTRIN 1.5/30, 21,) 1.5-30 MG-MCG tablet Take 1 tablet by mouth daily. 28 tablet 11   No current facility-administered medications on file prior to visit.   No Known Allergies Social History   Socioeconomic History  . Marital status: Single    Spouse name: Not on file  . Number of children: Not on file  . Years of education: Not on file  . Highest education level: Not on file  Occupational History  . Not on file  Tobacco Use  . Smoking status: Never Smoker  . Smokeless tobacco: Never Used  Substance and Sexual Activity  . Alcohol use: No  . Drug use: No  . Sexual activity:  Never  Other Topics Concern  . Not on file  Social History Narrative   Lives with mother and younger brother Kathleen Miles.  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  Wants to play basketball and run track.   Social Determinants of Health   Financial Resource Strain: Not on file  Food Insecurity: Not on file  Transportation Needs: Not on file  Physical Activity: Not on file  Stress: Not on file  Social Connections: Not on file  Intimate Partner Violence: Not on file     Review of Systems  All other systems reviewed and are negative.      Objective:   Physical Exam Vitals reviewed.  Constitutional:      General: She is not in acute distress.    Appearance: Normal appearance. She is not ill-appearing or toxic-appearing.  Cardiovascular:     Heart sounds: Normal heart sounds.  Pulmonary:     Breath sounds: Normal breath sounds.  Abdominal:     General: Bowel sounds are normal. There is no distension.     Palpations: Abdomen is soft.     Tenderness: There is no abdominal tenderness. There is no guarding.  Neurological:     Mental Status: She is alert.           Assessment & Plan:  Hematuria, unspecified type - Plan: Urinalysis, Routine w reflex microscopic  I believe the patient is likely having a kidney stone.  Begin Flomax 0.4 mg p.o. daily.  Use Norco 5/325 1 p.o. every 6 hours as needed pain.  Proceed with a CT scan renal stone protocol to evaluate size and location of the kidney stone and to confirm the diagnosis given the fact the pain has been present for a week and work-up today is negative.

## 2020-11-26 ENCOUNTER — Ambulatory Visit
Admission: RE | Admit: 2020-11-26 | Discharge: 2020-11-26 | Disposition: A | Discharge status: Home / Self Care | Payer: Medicaid Other | Source: Ambulatory Visit | Attending: Family Medicine | Admitting: Family Medicine

## 2020-11-26 DIAGNOSIS — R1031 Right lower quadrant pain: Secondary | ICD-10-CM

## 2020-11-26 DIAGNOSIS — R102 Pelvic and perineal pain: Secondary | ICD-10-CM

## 2020-12-08 ENCOUNTER — Other Ambulatory Visit: Payer: Self-pay | Admitting: Family Medicine

## 2020-12-08 DIAGNOSIS — R102 Pelvic and perineal pain: Secondary | ICD-10-CM

## 2021-03-27 ENCOUNTER — Other Ambulatory Visit: Payer: Self-pay | Admitting: Family Medicine

## 2021-05-16 ENCOUNTER — Other Ambulatory Visit: Payer: Self-pay

## 2021-05-16 ENCOUNTER — Ambulatory Visit (INDEPENDENT_AMBULATORY_CARE_PROVIDER_SITE_OTHER): Payer: Medicaid Other | Admitting: Family Medicine

## 2021-05-16 ENCOUNTER — Encounter: Payer: Self-pay | Admitting: Family Medicine

## 2021-05-16 VITALS — BP 118/64 | HR 78 | Temp 98.1°F | Resp 18 | Ht 60.0 in | Wt 114.0 lb

## 2021-05-16 DIAGNOSIS — N898 Other specified noninflammatory disorders of vagina: Secondary | ICD-10-CM | POA: Diagnosis not present

## 2021-05-16 DIAGNOSIS — R300 Dysuria: Secondary | ICD-10-CM | POA: Diagnosis not present

## 2021-05-16 LAB — MICROSCOPIC MESSAGE

## 2021-05-16 LAB — WET PREP FOR TRICH, YEAST, CLUE

## 2021-05-16 LAB — URINALYSIS, ROUTINE W REFLEX MICROSCOPIC
Bilirubin Urine: NEGATIVE
Casts: NONE SEEN /LPF
Crystals: NONE SEEN /HPF
Glucose, UA: NEGATIVE
Hyaline Cast: NONE SEEN /LPF
Ketones, ur: NEGATIVE
Leukocytes,Ua: NEGATIVE
Nitrite: NEGATIVE
Protein, ur: NEGATIVE
Specific Gravity, Urine: 1.025 (ref 1.001–1.035)
Yeast: NONE SEEN /HPF
pH: 6 (ref 5.0–8.0)

## 2021-05-16 NOTE — Progress Notes (Signed)
   Subjective:    Patient ID: Kathleen Miles, female    DOB: 11/05/2000, 20 y.o.   MRN: [id number]  HPI Had CT scan renal stone protocol in 5/22 due to hematuria which was normal patient presents with a 1 week history of increased urinary frequency.  She denies any dysuria.  She denies any fevers or chills.  However she does have the sensation that she has to urinate again a few minutes after she empties her bladder.  This is been going on since Thursday.  Around the same time she also has some red-tinged brownish vaginal discharge.  She is due to start her period this week.  She thought maybe that she was spotting.  She denies any itching or burning around the vagina.  She denies any pain in her abdomen or around the vagina.  Urinalysis today shows +2 blood but is otherwise normal.  There are no nitrates.  There is no leukocyte esterase to suggest a urinary tract infection.  Therefore I recommended a wet prep to evaluate for possible BV or trichomoniasis or a yeast infection.  She states that she is sexually active but she is in a monogamous relationship and she denies any possible exposure to gonorrhea or chlamydia.  She declines to allow me to do a pelvic exam with a speculum due to pain and discomfort that she experienced previously.  However after we discussed the situation, she is willing to perform a self wet prep. No past medical history on file. No past surgical history on file. Current Outpatient Medications on File Prior to Visit  Medication Sig Dispense Refill   JUNEL FE 1.5/30 1.5-30 MG-MCG tablet TAKE 1 TABLET BY MOUTH EVERY DAY 84 tablet 3   No current facility-administered medications on file prior to visit.     No Known Allergies     Review of Systems  All other systems reviewed and are negative.     Objective:   Physical Exam Vitals reviewed.  Constitutional:      General: She is not in acute distress.    Appearance: Normal appearance. She is not ill-appearing or  toxic-appearing.  Cardiovascular:     Heart sounds: Normal heart sounds.  Pulmonary:     Breath sounds: Normal breath sounds.  Abdominal:     General: Bowel sounds are normal. There is no distension.     Palpations: Abdomen is soft.     Tenderness: There is no abdominal tenderness. There is no guarding.  Neurological:     Mental Status: She is alert.          Assessment & Plan:  Dysuria - Plan: Urinalysis, Routine w reflex microscopic, Urine Culture  Vaginal discharge - Plan: WET PREP FOR TRICH, YEAST, CLUE No evidence of urinary tract infection on urinalysis.  Recommended a wet prep to rule out vaginitis.  Patient declines to allow me to perform a speculum exam but she will consent to doing a self wet prep.  She declines screening for gonorrhea or chlamydia.  Blood could be due to vaginal spotting and her period about to start this week.  She has already had a negative CT scan.  However if symptoms persist, neck step would be urology consultation for cystoscopy.  Given her young age I do not feel that this is necessary.  However interstitial cystitis is on the differential diagnosis. Wet prep is negative.

## 2021-05-17 LAB — URINE CULTURE
MICRO NUMBER:: 12529206
SPECIMEN QUALITY:: ADEQUATE

## 2021-07-11 ENCOUNTER — Encounter: Payer: Self-pay | Admitting: Family Medicine

## 2021-07-11 ENCOUNTER — Ambulatory Visit (INDEPENDENT_AMBULATORY_CARE_PROVIDER_SITE_OTHER): Payer: Medicaid Other | Admitting: Family Medicine

## 2021-07-11 ENCOUNTER — Other Ambulatory Visit: Payer: Self-pay

## 2021-07-11 VITALS — BP 102/68 | HR 65 | Resp 18 | Ht 60.0 in | Wt 114.0 lb

## 2021-07-11 DIAGNOSIS — Z111 Encounter for screening for respiratory tuberculosis: Secondary | ICD-10-CM

## 2021-07-11 NOTE — Progress Notes (Signed)
° °  Subjective:    Patient ID: Kathleen Miles, female    DOB: 2001-03-01, 20 y.o.   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  She needs a TB screening test.  She denies needing any other communicable disease testing.  She is already had her COVID shot and her flu shot.  We did not perform the skin TB test here but we do perform the QuantiFERON assay. History reviewed. No pertinent past medical history. History reviewed. No pertinent surgical history. Current Outpatient Medications on File Prior to Visit  Medication Sig Dispense Refill   JUNEL FE 1.5/30 1.5-30 MG-MCG tablet TAKE 1 TABLET BY MOUTH EVERY DAY 84 tablet 3   No current facility-administered medications on file prior to visit.     No Known Allergies     Review of Systems  All other systems reviewed and are negative.     Objective:   Physical Exam Vitals reviewed.  Constitutional:      General: She is not in acute distress.    Appearance: Normal appearance. She is not ill-appearing or toxic-appearing.  Cardiovascular:     Heart sounds: Normal heart sounds.  Pulmonary:     Breath sounds: Normal breath sounds.  Abdominal:     General: Bowel sounds are normal. There is no distension.     Palpations: Abdomen is soft.     Tenderness: There is no abdominal tenderness. There is no guarding.  Neurological:     Mental Status: She is alert.          Assessment & Plan:  Screening-pulmonary TB - Plan: QuantiFERON-TB Gold Plus Check QuantiFERON assay to screen for tuberculosis.  Patient is asymptomatic.  She denies any cough, fever, chills, night sweats, hemoptysis.

## 2021-07-17 LAB — QUANTIFERON-TB GOLD PLUS
Mitogen-NIL: >10 IU/mL
NIL: 0.06 IU/mL
QuantiFERON-TB Gold Plus: NEGATIVE
TB1-NIL: <0 IU/mL
TB2-NIL: <0 IU/mL

## 2021-08-05 ENCOUNTER — Encounter: Payer: Self-pay | Admitting: Family Medicine

## 2022-01-06 ENCOUNTER — Other Ambulatory Visit: Payer: Self-pay | Admitting: Family Medicine

## 2022-01-07 NOTE — Telephone Encounter (Signed)
Requested Prescriptions  Pending Prescriptions Disp Refills  . JUNEL FE 1.5/30 1.5-30 MG-MCG tablet [Pharmacy Med Name: JUNEL FE 1.5 MG-30 MCG TABLET] 84 tablet 3    Sig: TAKE 1 TABLET BY MOUTH EVERY DAY     OB/GYN:  Contraceptives Passed - 01/07/2022 12:53 PM      Passed - Last BP in normal range    BP Readings from Last 1 Encounters:  07/11/21 102/68         Passed - Valid encounter within last 12 months    Recent Outpatient Visits          6 months ago Screening-pulmonary TB   [facility] Pickard, Priscille Heidelberg, MD   7 months ago Dysuria   [facility] Donita Brooks, MD   1 year ago Hematuria, unspecified type   [facility] Donita Brooks, MD   1 year ago Weight loss, abnormal   [facility] Pickard, Priscille Heidelberg, MD   1 year ago Dysuria   [facility] Pickard, Priscille Heidelberg, MD             Passed - Patient is not a smoker

## 2022-03-27 ENCOUNTER — Ambulatory Visit: Payer: Self-pay | Admitting: *Deleted

## 2022-03-27 NOTE — Telephone Encounter (Signed)
  Chief Complaint: ankle pain/swelling Symptoms: pain, swelling ankle-possible  severe sprain , bruising Frequency: 1 week Pertinent Negatives: Patient denies severe pain- is able to put weight on ankle- but does have swelling and pain after use. Disposition: [] ED /[x] Urgent Care (no appt availability in office) / [] Appointment(In office/virtual)/ []  [place] [facility]/ [] Home Care/ [] Refused Recommended Disposition /[] [facility]/ []  Follow-up with PCP Additional Notes: Patient advised ortho UC for evaluation of ankle injury   Reason for Disposition  Large swelling or bruise (> 2 inches or 5 cm)  Answer Assessment - Initial Assessment Questions 1. MECHANISM: "How did the injury happen?" (e.g., twisting injury, direct blow)      Twisted ankle- stepped wrong 2. ONSET: "When did the injury happen?" (Minutes or hours ago)      1 week ago 3. LOCATION: "Where is the injury located?"      R ankle 4. APPEARANCE of INJURY: "What does the injury look like?"      Bruising on ankle and foot, swelling- comes and goes 5. WEIGHT-BEARING: "Can you put weight on that foot?" "Can you walk (four steps or more)?"       Yes- able to walk on foot- painful 6. SIZE: For cuts, bruises, or swelling, ask: "How large is it?" (e.g., inches or centimeters;  entire joint)      Bruise- top of foot and ankle 7. PAIN: "Is there pain?" If Yes, ask: "How bad is the pain?"    (e.g., Scale 1-10; or mild, moderate, severe)   - NONE (0): no pain.   - MILD (1-3): doesn't interfere with normal activities.    - MODERATE (4-7): interferes with normal activities (e.g., work or school) or awakens from sleep, limping.    - SEVERE (8-10): excruciating pain, unable to do any normal activities, unable to walk.      Outer bone of ankle, mild 8. TETANUS: For any breaks in the skin, ask: "When was the last tetanus booster?"     N/a 9. OTHER SYMPTOMS: "Do you have any other symptoms?"      no 10. PREGNANCY: "Is there  any chance you are pregnant?" "When was your last menstrual period?"       No- LMP- last Thrusday  Protocols used: Ankle and Foot Injury-A-AH

## 2022-07-12 IMAGING — CT CT RENAL STONE PROTOCOL
2 of 4 series · 11 of 46 positions shown, 12 images · non-contrast
Comparison: None.

CLINICAL DATA: Right lower quadrant and right flank pain for 1
week, hematuria, kidney stones suspected

EXAM:
CT ABDOMEN AND PELVIS WITHOUT CONTRAST
TECHNIQUE: Multidetector CT imaging of the abdomen and pelvis was performed
following the standard protocol without IV contrast.

[Series 2: renal stone 5.00 br40 s3 axial · axial · 0.38mm/px · z∈[+1292,+1632]mm · 8 of 82 slices shown, 9 images]
[im 7/82  soft-tissue]
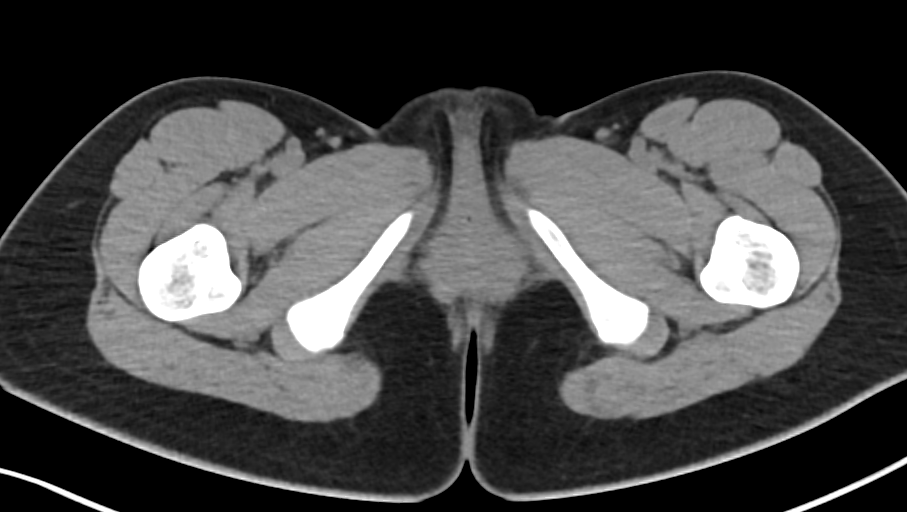
[im 7/82  bone]
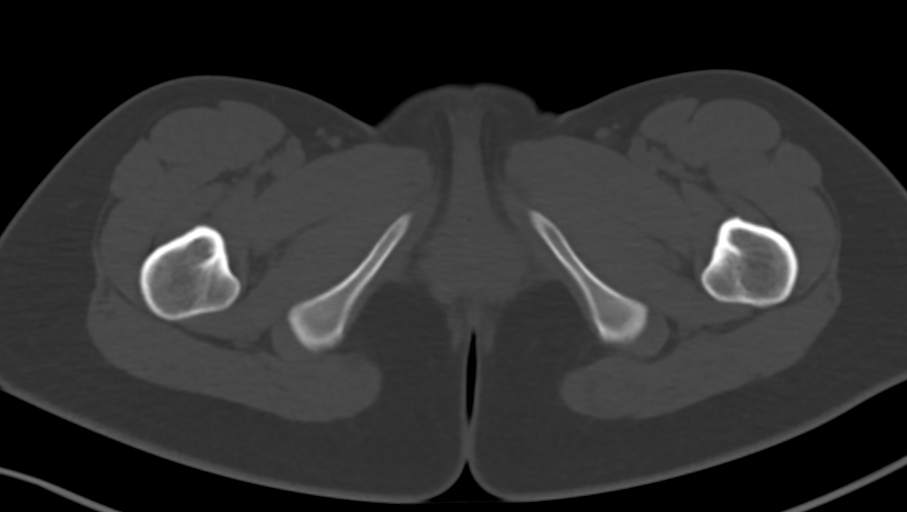
[im 17/82  soft-tissue]
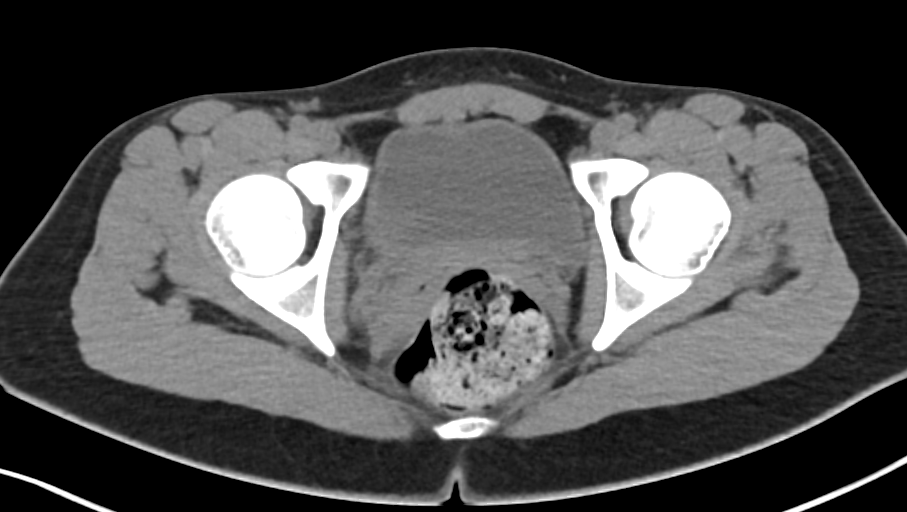
[im 26/82  soft-tissue]
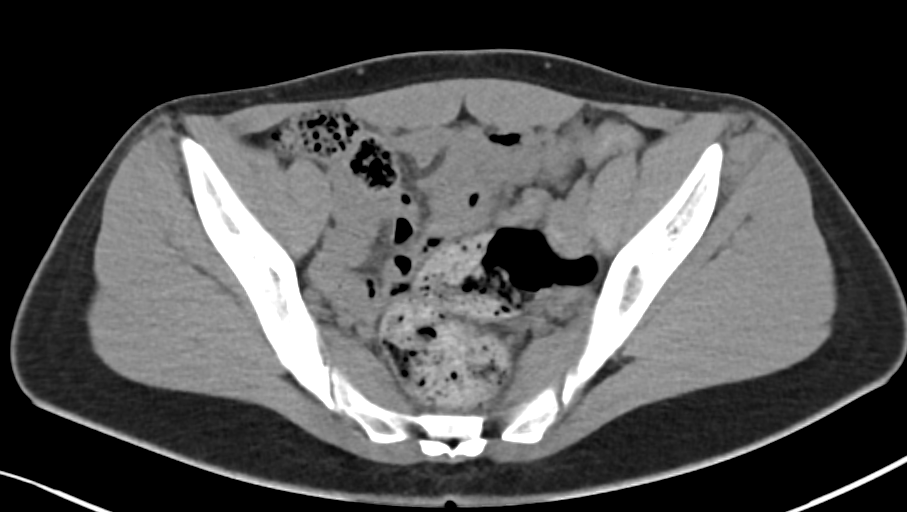
[im 36/82  soft-tissue]
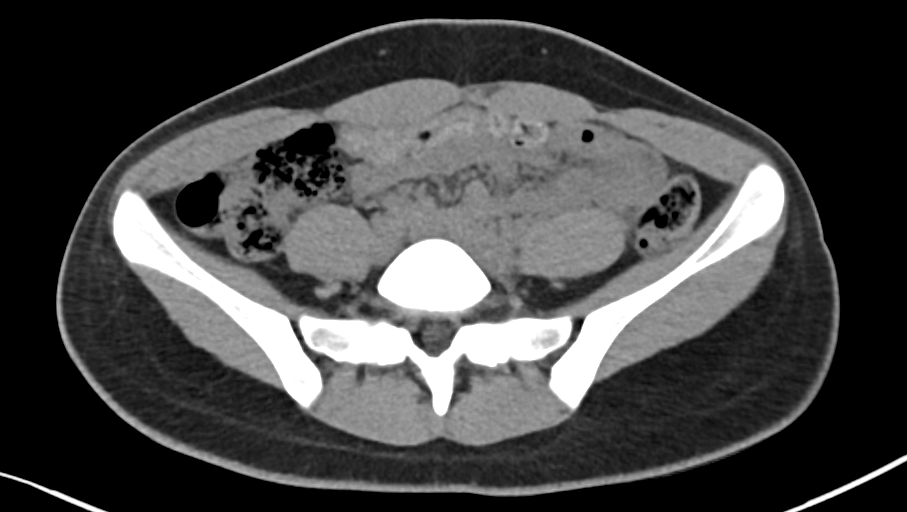
[im 46/82  soft-tissue]
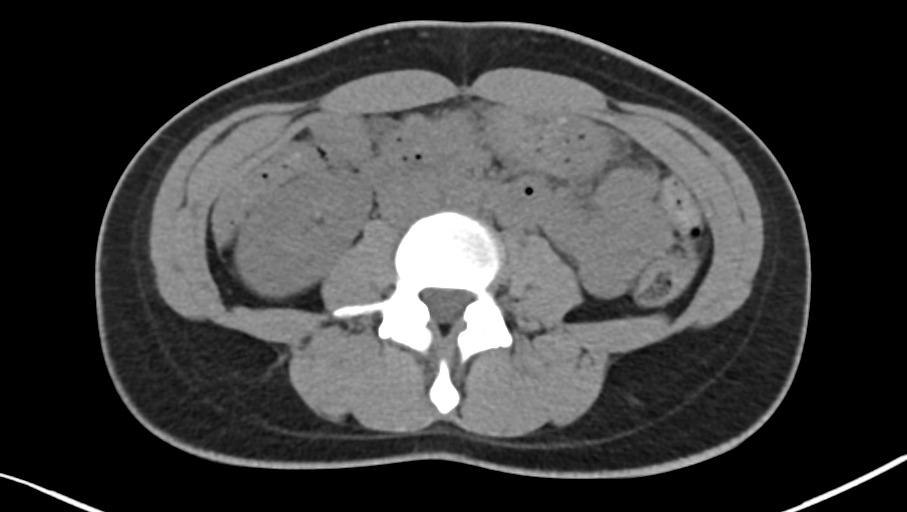
[im 56/82  soft-tissue]
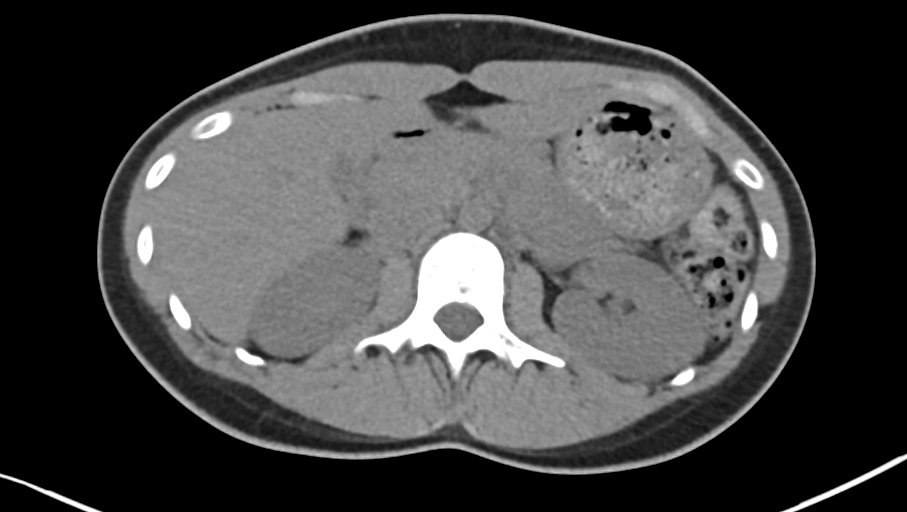
[im 65/82  soft-tissue]
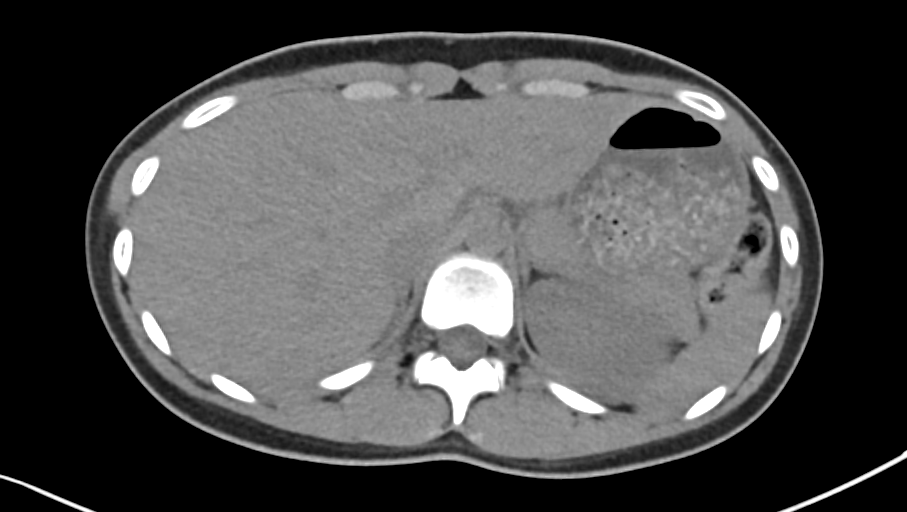
[im 75/82  soft-tissue]
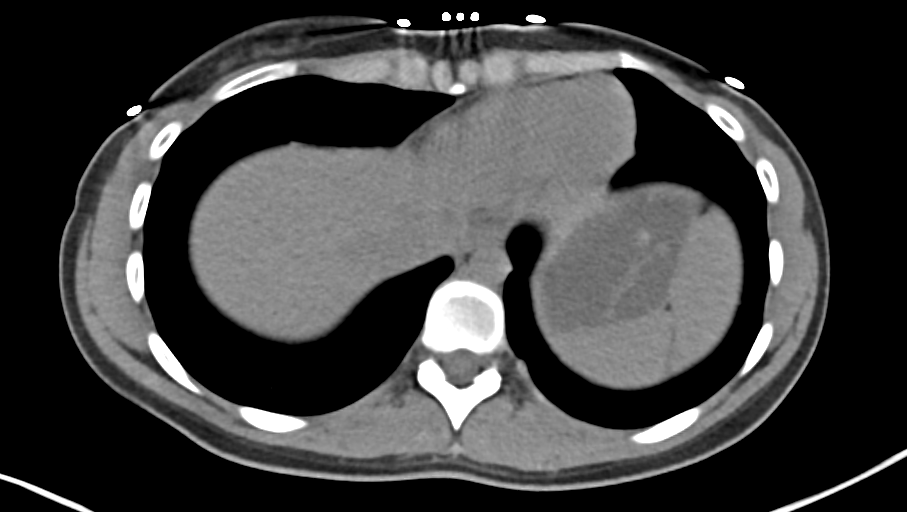

[Series 6: renal stone 2.00 br40 s3 cor · coronal · 0.68mm/px · 3 of 99 slices shown]
[im 33/99  soft-tissue]
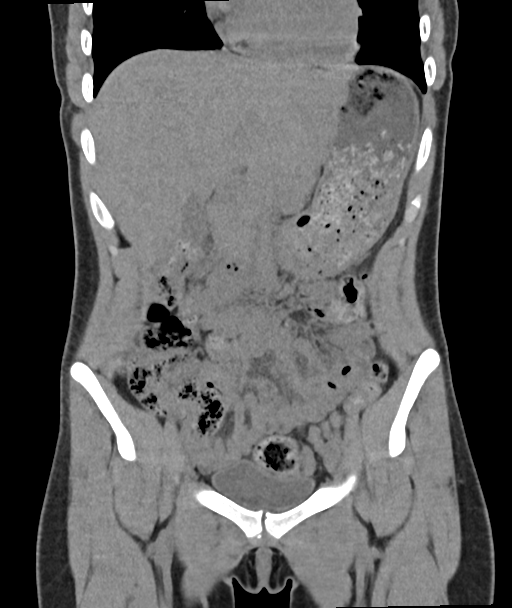
[im 44/99  soft-tissue]
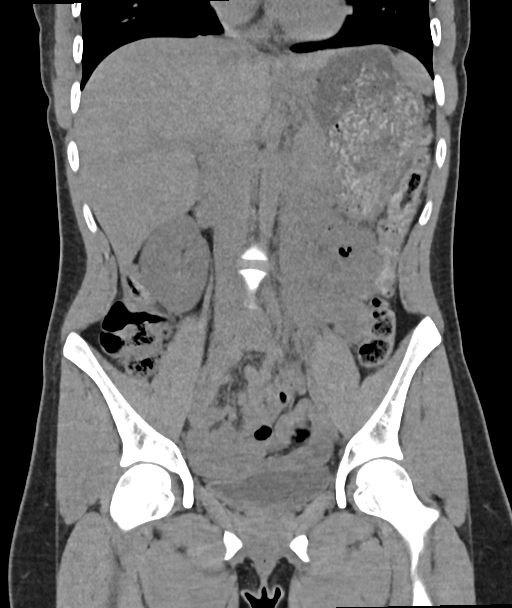
[im 55/99  soft-tissue]
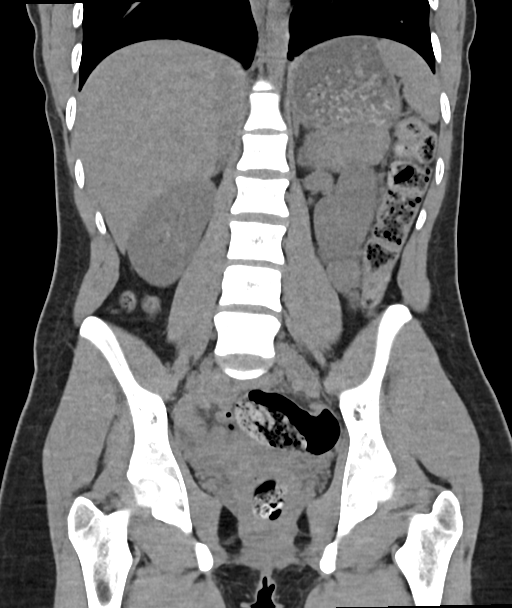

[11 of 46 positions shown; findings below may reference images not displayed]

FINDINGS: Lower chest: No acute abnormality.

Hepatobiliary: No solid liver abnormality is seen. No gallstones,
gallbladder wall thickening, or biliary dilatation.

Pancreas: Unremarkable. No pancreatic ductal dilatation or
surrounding inflammatory changes.

Spleen: Normal in size without significant abnormality.

Adrenals/Urinary Tract: Adrenal glands are unremarkable. There are
faint bilateral medullary calcifications without discrete calculi
identified. No hydronephrosis. Bladder is unremarkable.

Stomach/Bowel: Stomach is within normal limits. Appendix appears
normal. No evidence of bowel wall thickening, distention, or
inflammatory changes.

Vascular/Lymphatic: No significant vascular findings are present. No
enlarged abdominal or pelvic lymph nodes.

Reproductive: No mass or other significant abnormality.

Other: No abdominal wall hernia or abnormality. No abdominopelvic
ascites.

Musculoskeletal: No acute or significant osseous findings.
IMPRESSION: 1. There are faint bilateral medullary calcifications without
discrete urinary tract calculi identified. No hydronephrosis.
2. Normal appendix.

## 2022-11-25 ENCOUNTER — Telehealth: Payer: Self-pay

## 2022-11-25 ENCOUNTER — Other Ambulatory Visit: Payer: Self-pay

## 2022-11-25 DIAGNOSIS — N939 Abnormal uterine and vaginal bleeding, unspecified: Secondary | ICD-10-CM

## 2022-11-25 MED ORDER — NORETHIN ACE-ETH ESTRAD-FE 1.5-30 MG-MCG PO TABS: 1.0000 | ORAL_TABLET | Freq: Every day | ORAL | 1 refills | Status: DC

## 2022-11-25 NOTE — Telephone Encounter (Signed)
Prescription Request  11/25/2022  LOV: 07/11/21  What is the name of the medication or equipment? JUNEL FE 1.5/30 1.5-30 MG-MCG tablet [[id number]]  Have you contacted your pharmacy to request a refill? Yes   Which pharmacy would you like this sent to?  [id number] [place], [place] - [address] [place] [address] Phone: [phone] Fax: [phone]    Patient notified that their request is being sent to the clinical staff for review and that they should receive a response within 2 business days.   Please advise at Mobile [phone] (mobile)

## 2022-12-05 ENCOUNTER — Ambulatory Visit
Admission: EM | Admit: 2022-12-05 | Discharge: 2022-12-05 | Disposition: A | Discharge status: Home / Self Care | Payer: BC Managed Care – PPO

## 2022-12-05 DIAGNOSIS — H5789 Other specified disorders of eye and adnexa: Secondary | ICD-10-CM

## 2022-12-05 NOTE — ED Triage Notes (Addendum)
Pt reports she tried to put her contact lense in her left eye and it got stuck x 1 day. Pt states her left eye has a lot of  pressure.She states she was seeing blurring yesterday but it cleared it.    Reports no visual disturbances now.

## 2022-12-05 NOTE — Discharge Instructions (Signed)
Leave contacts out for 24 hours.

## 2022-12-05 NOTE — ED Provider Notes (Signed)
[facility]    CSN: [id number] Arrival date & time: 12/05/22  1206      History   Chief Complaint No chief complaint on file.   HPI Kathleen Miles is a 22 y.o. female.   Patient complains of not being able to find her contact in her left eye.  Patient is unsure if it fell out or if it still could be in her eye.  Patient reports she has not been able to remove.  She denies any blurred vision she denies any pain  The history is provided by the patient. No language interpreter was used.    History reviewed. No pertinent past medical history.  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 No pertinent surgical history.  OB History   No obstetric history on file.      Home Medications    Prior to Admission medications   Medication Sig Start Date End Date Taking? Authorizing Provider  norethindrone-ethinyl estradiol-iron (JUNEL FE 1.5/30) 1.5-30 MG-MCG tablet Take 1 tablet by mouth daily. 11/25/22   Donita Brooks, MD    Family History Family History  Problem Relation Age of Onset   HIV Father     Social History Social History   Tobacco Use   Smoking status: Never   Smokeless tobacco: Never  Substance Use Topics   Alcohol use: No   Drug use: No     Allergies   Patient has no known allergies.   Review of Systems Review of Systems  All other systems reviewed and are negative.    Physical Exam Triage Vital Signs ED Triage Vitals [12/05/22 1222]  Enc Vitals Group     BP 116/75     Pulse Rate (!) 56     Resp 18     Temp 98.8 F (37.1 C)     Temp Source Oral     SpO2 97 %     Weight      Height      Head Circumference      Peak Flow      Pain Score      Pain Loc      Pain Edu?      Excl. in GC?    No data found.  Updated Vital Signs BP 116/75 (BP Location:  Right Arm)   Pulse (!) 56   Temp 98.8 F (37.1 C) (Oral)   Resp 18   LMP  (Within Weeks)   SpO2 97%   Visual Acuity Right Eye Distance: 20/25 Left Eye Distance: 2020 Bilateral Distance: 20/40  Right Eye Near:   Left Eye Near:    Bilateral Near:     Physical Exam Vitals reviewed.  Constitutional:      Appearance: Normal appearance.  HENT:     Head: Normocephalic.     Mouth/Throat:     Mouth: Mucous membranes are moist.  Eyes:     Extraocular Movements: Extraocular movements intact.     Conjunctiva/sclera: Conjunctivae normal.     Pupils: Pupils are equal, round, and reactive to light.     Comments: Fluorescein no uptake, slight injection left eye, no fluorescein uptake was able to look under both eyelids no contact visualized.  Right eye also no contact  Skin:    General: Skin is warm.  Neurological:     General: No focal deficit  present.     Mental Status: She is alert.  Psychiatric:        Mood and Affect: Mood normal.      UC Treatments / Results  Labs (all labs ordered are listed, but only abnormal results are displayed) Labs Reviewed - No data to display  EKG   Radiology No results found.  Procedures Procedures (including critical care time)  Medications Ordered in UC Medications - No data to display  Initial Impression / Assessment and Plan / UC Course  I have reviewed the triage vital signs and the nursing notes.  Pertinent labs & imaging results that were available during my care of the patient were reviewed by me and considered in my medical decision making (see chart for details).     Patient advised I do not see contact in her eye I doubt there is retained contact patient is advised to not wear her contacts today.  Patient advised to start refresh pair tomorrow Final Clinical Impressions(s) / UC Diagnoses   Final diagnoses:  Irritation of left eye     Discharge Instructions      Leave contacts out for 24 hours.    ED  Prescriptions   None    PDMP not reviewed this encounter. An After Visit Summary was printed and given to the patient.       Elson Areas, [place] 12/05/22 1407

## 2022-12-09 ENCOUNTER — Encounter: Payer: Self-pay | Admitting: Family Medicine

## 2022-12-09 ENCOUNTER — Ambulatory Visit (INDEPENDENT_AMBULATORY_CARE_PROVIDER_SITE_OTHER): Payer: BC Managed Care – PPO | Admitting: Family Medicine

## 2022-12-09 VITALS — BP 98/58 | HR 74 | Temp 98.6°F | Ht 60.0 in | Wt 113.2 lb

## 2022-12-09 DIAGNOSIS — Z202 Contact with and (suspected) exposure to infections with a predominantly sexual mode of transmission: Secondary | ICD-10-CM | POA: Diagnosis not present

## 2022-12-09 DIAGNOSIS — Z124 Encounter for screening for malignant neoplasm of cervix: Secondary | ICD-10-CM

## 2022-12-09 DIAGNOSIS — N939 Abnormal uterine and vaginal bleeding, unspecified: Secondary | ICD-10-CM

## 2022-12-09 MED ORDER — NORETHIN ACE-ETH ESTRAD-FE 1.5-30 MG-MCG PO TABS: 1.0000 | ORAL_TABLET | Freq: Every day | ORAL | 11 refills | Status: DC

## 2022-12-09 NOTE — Progress Notes (Signed)
   Subjective:    Patient ID: Kathleen Miles, female    DOB: [birth year], 22 y.o.   MRN: [id number]  HPI Patient is requesting a refill on her birth control.  She denies any irregular vaginal bleeding.  She denies any heavy menstrual cramps or abdominal pain.  She is sexually active.  She is concerned about possible STD exposure.  She would like to be checked for gonorrhea and chlamydia as well as HIV.  She is also due for Pap smear. No past medical history on file. No past surgical history on file. Current Outpatient Medications on File Prior to Visit  Medication Sig Dispense Refill   norethindrone-ethinyl estradiol-iron (JUNEL FE 1.5/30) 1.5-30 MG-MCG tablet Take 1 tablet by mouth daily. 30 tablet 1   No current facility-administered medications on file prior to visit.     No Known Allergies     Review of Systems  All other systems reviewed and are negative.      Objective:   Physical Exam Vitals reviewed. Exam conducted with a chaperone present.  Constitutional:      General: She is not in acute distress.    Appearance: Normal appearance. She is not ill-appearing or toxic-appearing.  Cardiovascular:     Heart sounds: Normal heart sounds.  Pulmonary:     Breath sounds: Normal breath sounds.  Abdominal:     General: Bowel sounds are normal. There is no distension.     Palpations: Abdomen is soft.     Tenderness: There is no abdominal tenderness. There is no guarding.     Hernia: There is no hernia in the left inguinal area or right inguinal area.  Genitourinary:    Exam position: Lithotomy position.     Pubic Area: No rash.      Labia:        Right: No rash.        Left: No rash.      Vagina: Normal.     Cervix: Dilated. Cervical bleeding present. No cervical motion tenderness or friability.     Uterus: Normal.      Adnexa: Right adnexa normal and left adnexa normal.  Lymphadenopathy:     Lower Body: No right inguinal adenopathy. No left inguinal adenopathy.   Neurological:     Mental Status: She is alert.           Assessment & Plan:  Possible exposure to STD - Plan: CBC with Differential/Platelet, COMPLETE METABOLIC PANEL WITH GFR, HIV antibody (with reflex), C. trachomatis/N. gonorrhoeae RNA  Cervical cancer screening - Plan: PAP, Thin Prep w/HPV rflx HPV Type 16/18 I will screen for cervical cancer with a Pap smear with.  Meanwhile check CBC and CMP along with HIV and perform gonorrhea and chlamydia probe.  Possible exposure to STD.  Refill the patient's birth control.  Reiterated safe sex practices

## 2022-12-10 LAB — PAP, TP IMAGING W/ HPV RNA, RFLX HPV TYPE 16,18/45: HPV DNA High Risk: NOT DETECTED

## 2022-12-10 LAB — COMPLETE METABOLIC PANEL WITH GFR
AG Ratio: 1.3 (calc) (ref 1.0–2.5)
ALT: 9 U/L (ref 6–29)
AST: 14 U/L (ref 10–30)
Albumin: 4.1 g/dL (ref 3.6–5.1)
Alkaline phosphatase (APISO): 34 U/L (ref 31–125)
BUN: 8 mg/dL (ref 7–25)
CO2: 23 mmol/L (ref 20–32)
Calcium: 8.6 mg/dL (ref 8.6–10.2)
Chloride: 105 mmol/L (ref 98–110)
Creat: 0.63 mg/dL (ref 0.50–0.96)
Globulin: 3.1 g/dL (calc) (ref 1.9–3.7)
Glucose, Bld: 88 mg/dL (ref 65–99)
Potassium: 3.9 mmol/L (ref 3.5–5.3)
Sodium: 138 mmol/L (ref 135–146)
Total Bilirubin: 0.3 mg/dL (ref 0.2–1.2)
Total Protein: 7.2 g/dL (ref 6.1–8.1)
eGFR: 129 mL/min/{1.73_m2} (ref >=60)

## 2022-12-10 LAB — CBC WITH DIFFERENTIAL/PLATELET
Absolute Monocytes: 354 cells/uL (ref 200–950)
Basophils Absolute: 42 cells/uL (ref 0–200)
Basophils Relative: 0.7 %
Eosinophils Absolute: 120 cells/uL (ref 15–500)
Eosinophils Relative: 2 %
HCT: 40.5 % (ref 35.0–45.0)
Hemoglobin: 13.7 g/dL (ref 11.7–15.5)
Lymphs Abs: 2616 cells/uL (ref 850–3900)
MCH: 32.5 pg (ref 27.0–33.0)
MCHC: 33.8 g/dL (ref 32.0–36.0)
MCV: 96.2 fL (ref 80.0–100.0)
MPV: 11.2 fL (ref 7.5–12.5)
Monocytes Relative: 5.9 %
Neutro Abs: 2868 cells/uL (ref 1500–7800)
Neutrophils Relative %: 47.8 %
Platelets: 199 10*3/uL (ref 140–400)
RBC: 4.21 10*6/uL (ref 3.80–5.10)
RDW: 11.8 % (ref 11.0–15.0)
Total Lymphocyte: 43.6 %
WBC: 6 10*3/uL (ref 3.8–10.8)

## 2022-12-10 LAB — HIV ANTIBODY (ROUTINE TESTING W REFLEX): HIV 1&2 Ab, 4th Generation: NONREACTIVE

## 2022-12-10 LAB — C. TRACHOMATIS/N. GONORRHOEAE RNA
C. trachomatis RNA, TMA: NOT DETECTED
N. gonorrhoeae RNA, TMA: NOT DETECTED

## 2022-12-12 ENCOUNTER — Telehealth: Payer: Self-pay

## 2022-12-12 NOTE — Telephone Encounter (Signed)
Pt called in wanting to speak with nurse again about lab results. Please advise  Cb#: [phone]

## 2023-04-16 ENCOUNTER — Encounter (HOSPITAL_BASED_OUTPATIENT_CLINIC_OR_DEPARTMENT_OTHER): Payer: Self-pay | Admitting: Emergency Medicine

## 2023-04-16 ENCOUNTER — Emergency Department (HOSPITAL_BASED_OUTPATIENT_CLINIC_OR_DEPARTMENT_OTHER)
Admission: EM | Admit: 2023-04-16 | Discharge: 2023-04-16 | Disposition: A | Discharge status: Home / Self Care | Payer: Medicaid Other | Attending: Emergency Medicine | Admitting: Emergency Medicine

## 2023-04-16 ENCOUNTER — Emergency Department (HOSPITAL_BASED_OUTPATIENT_CLINIC_OR_DEPARTMENT_OTHER): Payer: Medicaid Other

## 2023-04-16 ENCOUNTER — Other Ambulatory Visit: Payer: Self-pay

## 2023-04-16 ENCOUNTER — Ambulatory Visit
Admission: EM | Admit: 2023-04-16 | Discharge: 2023-04-16 | Disposition: A | Discharge status: Home / Self Care | Payer: Medicaid Other | Attending: Nurse Practitioner | Admitting: Nurse Practitioner

## 2023-04-16 DIAGNOSIS — R109 Unspecified abdominal pain: Secondary | ICD-10-CM | POA: Diagnosis not present

## 2023-04-16 DIAGNOSIS — E876 Hypokalemia: Secondary | ICD-10-CM | POA: Insufficient documentation

## 2023-04-16 DIAGNOSIS — R1031 Right lower quadrant pain: Secondary | ICD-10-CM | POA: Insufficient documentation

## 2023-04-16 DIAGNOSIS — M545 Low back pain, unspecified: Secondary | ICD-10-CM | POA: Diagnosis not present

## 2023-04-16 LAB — COMPREHENSIVE METABOLIC PANEL
ALT: 9 U/L (ref 0–44)
AST: 13 U/L — ABNORMAL LOW (ref 15–41)
Albumin: 4.3 g/dL (ref 3.5–5.0)
Alkaline Phosphatase: 37 U/L — ABNORMAL LOW (ref 38–126)
Anion gap: 7 (ref 5–15)
BUN: 9 mg/dL (ref 6–20)
CO2: 26 mmol/L (ref 22–32)
Calcium: 9 mg/dL (ref 8.9–10.3)
Chloride: 105 mmol/L (ref 98–111)
Creatinine, Ser: 0.63 mg/dL (ref 0.44–1.00)
GFR, Estimated: >60 mL/min (ref >60)
Glucose, Bld: 105 mg/dL — ABNORMAL HIGH (ref 70–99)
Potassium: 3.4 mmol/L — ABNORMAL LOW (ref 3.5–5.1)
Sodium: 138 mmol/L (ref 135–145)
Total Bilirubin: 0.3 mg/dL (ref 0.3–1.2)
Total Protein: 7.5 g/dL (ref 6.5–8.1)

## 2023-04-16 LAB — POCT URINALYSIS DIP (MANUAL ENTRY)
Bilirubin, UA: NEGATIVE
Glucose, UA: NEGATIVE mg/dL
Ketones, POC UA: NEGATIVE mg/dL
Leukocytes, UA: NEGATIVE
Nitrite, UA: NEGATIVE
Protein Ur, POC: 100 mg/dL — AB
Spec Grav, UA: 1.02 (ref 1.010–1.025)
Urobilinogen, UA: 1 E.U./dL
pH, UA: 8.5 — AB (ref 5.0–8.0)

## 2023-04-16 LAB — URINALYSIS, ROUTINE W REFLEX MICROSCOPIC
Bacteria, UA: NONE SEEN
Bilirubin Urine: NEGATIVE
Glucose, UA: NEGATIVE mg/dL
Ketones, ur: NEGATIVE mg/dL
Leukocytes,Ua: NEGATIVE
Nitrite: NEGATIVE
Specific Gravity, Urine: 1.024 (ref 1.005–1.030)
pH: 7.5 (ref 5.0–8.0)

## 2023-04-16 LAB — CBC
HCT: 37.7 % (ref 36.0–46.0)
Hemoglobin: 13.3 g/dL (ref 12.0–15.0)
MCH: 32.4 pg (ref 26.0–34.0)
MCHC: 35.3 g/dL (ref 30.0–36.0)
MCV: 92 fL (ref 80.0–100.0)
Platelets: 233 10*3/uL (ref 150–400)
RBC: 4.1 MIL/uL (ref 3.87–5.11)
RDW: 11.8 % (ref 11.5–15.5)
WBC: 8.3 10*3/uL (ref 4.0–10.5)
nRBC: 0 % (ref 0.0–0.2)

## 2023-04-16 LAB — PREGNANCY, URINE: Preg Test, Ur: NEGATIVE

## 2023-04-16 LAB — LIPASE, BLOOD: Lipase: 52 U/L — ABNORMAL HIGH (ref 11–51)

## 2023-04-16 MED ORDER — KETOROLAC TROMETHAMINE 15 MG/ML IJ SOLN
15.0000 mg | Freq: Once | INTRAMUSCULAR | Status: AC
  Administered 2023-04-16: 15 mg via INTRAVENOUS
  Filled 2023-04-16: qty 1

## 2023-04-16 MED ORDER — IOHEXOL 300 MG/ML  SOLN
100.0000 mL | Freq: Once | INTRAMUSCULAR | Status: AC | PRN
  Administered 2023-04-16: 80 mL via INTRAVENOUS

## 2023-04-16 NOTE — ED Provider Notes (Signed)
[facility]    CSN: [id number] Arrival date & time: 04/16/23  1623      History   Chief Complaint No chief complaint on file.   HPI Kathleen Miles is a 22 y.o. female.   The history is provided by the patient.   Patient presents for complaints of abdominal pain that is been present for the past week.  She complains of pain in her lower back and in the lower right side of her abdomen.  She also reports that she has been having episodes of diarrhea and both constipation since symptoms started along with frequent urination.  Patient denies fever, chills, upper respiratory symptoms, chest pain, nausea, vomiting, urinary symptoms, vaginal symptoms, gas or bloating.  Patient reports that she does have a history of kidney stones.  Patient states that the abdominal pain has also moved as it was more on the left side of her abdomen when initially started.  She reports that pain starts after she eats.  She reports that she had chicken and rice today.  She currently rates her pain 8/10 at present.   History reviewed. No pertinent past medical history.  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 No pertinent surgical history.  OB History   No obstetric history on file.      Home Medications    Prior to Admission medications   Medication Sig Start Date End Date Taking? Authorizing Provider  norethindrone-ethinyl estradiol-iron (JUNEL FE 1.5/30) 1.5-30 MG-MCG tablet Take 1 tablet by mouth daily. 12/09/22   Donita Brooks, MD    Family History Family History  Problem Relation Age of Onset   HIV Father     Social History Social History   Tobacco Use   Smoking status: Never   Smokeless tobacco: Never  Substance Use Topics   Alcohol use: No   Drug use: No     Allergies   Patient has no known  allergies.   Review of Systems Review of Systems Per HPI  Physical Exam Triage Vital Signs ED Triage Vitals  Encounter Vitals Group     BP 04/16/23 1632 (!) 149/92     Systolic BP Percentile --      Diastolic BP Percentile --      Pulse Rate 04/16/23 1632 88     Resp 04/16/23 1632 18     Temp 04/16/23 1632 98.6 F (37 C)     Temp Source 04/16/23 1632 Oral     SpO2 04/16/23 1632 98 %     Weight --      Height --      Head Circumference --      Peak Flow --      Pain Score 04/16/23 1633 8     Pain Loc --      Pain Education --      Exclude from Growth Chart --    No data found.  Updated Vital Signs BP (!) 149/92 (BP Location: Right Arm)   Pulse 88   Temp 98.6 F (37 C) (Oral)   Resp 18   LMP 04/01/2023   SpO2 98%   Visual Acuity Right Eye Distance:   Left Eye Distance:   Bilateral Distance:    Right Eye Near:   Left Eye Near:    Bilateral Near:     Physical Exam Vitals and  nursing note reviewed.  Constitutional:      General: She is not in acute distress.    Appearance: Normal appearance.  Eyes:     Extraocular Movements: Extraocular movements intact.     Pupils: Pupils are equal, round, and reactive to light.  Cardiovascular:     Rate and Rhythm: Normal rate and regular rhythm.     Pulses: Normal pulses.     Heart sounds: Normal heart sounds.  Pulmonary:     Effort: Pulmonary effort is normal.     Breath sounds: Normal breath sounds.  Abdominal:     General: Abdomen is flat. Bowel sounds are normal.     Palpations: Abdomen is soft.     Tenderness: There is abdominal tenderness in the right upper quadrant. There is right CVA tenderness. There is no rebound.  Skin:    General: Skin is warm and dry.  Neurological:     General: No focal deficit present.     Mental Status: She is alert and oriented to person, place, and time.  Psychiatric:        Mood and Affect: Mood normal.        Behavior: Behavior normal.      UC Treatments / Results   Labs (all labs ordered are listed, but only abnormal results are displayed) Labs Reviewed  POCT URINALYSIS DIP (MANUAL ENTRY) - Abnormal; Notable for the following components:      Result Value   Blood, UA small (*)    pH, UA 8.5 (*)    Protein Ur, POC =100 (*)    All other components within normal limits    EKG   Radiology No results found.  Procedures Procedures (including critical care time)  Medications Ordered in UC Medications - No data to display  Initial Impression / Assessment and Plan / UC Course  I have reviewed the triage vital signs and the nursing notes.  Pertinent labs & imaging results that were available during my care of the patient were reviewed by me and considered in my medical decision making (see chart for details).  Patient presents for complaints of abdominal pain that been present for the past week.  Urinalysis does not indicate an obvious infection.  She does have moderate tenderness on the right side of her abdomen.  Patient denies fever, chills, nausea or vomiting.  Cannot rule out acute abdomen given the severity of her abdominal pain.  Unable to provide an accurate diagnosis as patient most likely will need a CT scan of her abdomen along with pain control.  Discussed same with patient and advised patient go to the emergency department for further evaluation.  Her vital signs are stable, she is able to travel via private vehicle.  Patient was ambulatory at discharge.  Patient discharged to the emergency department for further evaluation.  Final Clinical Impressions(s) / UC Diagnoses   Final diagnoses:  None   Discharge Instructions   None    ED Prescriptions   None    PDMP not reviewed this encounter.   Abran Cantor, NP 04/16/23 1942

## 2023-04-16 NOTE — Discharge Instructions (Signed)
Go to the emergency department for further evaluation

## 2023-04-16 NOTE — ED Provider Notes (Signed)
[place] EMERGENCY DEPARTMENT AT [facility] Provider Note   CSN: [id number] Arrival date & time: 04/16/23  1753     History  Chief Complaint  Patient presents with   Abdominal Pain    Kathleen Miles is a 22 y.o. female who presents to ED concerned for RLQ abdominal pain and lower back pain since Thursday.  Patient stating that symptoms flare whenever she eats a meal.  Also endorsing subjective fevers. Last non-bloody BM yesterday. LMP 04/01/2023. Has been taking Ibuprofen/Tylenol with some relief.   Denies chest pain, dyspnea, cough, nausea, vomiting, dysuria, hematuria, hematochezia. Denies vaginal discharge/irritation. Patient denies urinary retention, fecal incontinence, saddle anesthesia, lower extremity weakness, hx of cancer, fever, immunosuppression, IVDU, spinal procedure, significant trauma.    Abdominal Pain      Home Medications Prior to Admission medications   Medication Sig Start Date End Date Taking? Authorizing Provider  norethindrone-ethinyl estradiol-iron (JUNEL FE 1.5/30) 1.5-30 MG-MCG tablet Take 1 tablet by mouth daily. 12/09/22   Donita Brooks, MD      Allergies    Patient has no known allergies.    Review of Systems   Review of Systems  Gastrointestinal:  Positive for abdominal pain.    Physical Exam Updated Vital Signs BP 127/77 (BP Location: Right Arm)   Pulse 79   Temp 99 F (37.2 C)   Resp 18   LMP 04/01/2023   SpO2 100%  Physical Exam Vitals and nursing note reviewed.  Constitutional:      General: She is not in acute distress.    Appearance: She is not ill-appearing or toxic-appearing.  HENT:     Head: Normocephalic and atraumatic.     Mouth/Throat:     Mouth: Mucous membranes are moist.     Pharynx: No posterior oropharyngeal erythema.  Eyes:     General: No scleral icterus.       Right eye: No discharge.        Left eye: No discharge.     Conjunctiva/sclera: Conjunctivae normal.  Cardiovascular:      Rate and Rhythm: Normal rate and regular rhythm.     Pulses: Normal pulses.     Heart sounds: Normal heart sounds. No murmur heard. Pulmonary:     Effort: Pulmonary effort is normal. No respiratory distress.     Breath sounds: Normal breath sounds. No wheezing, rhonchi or rales.  Abdominal:     General: Abdomen is flat. There is no distension.     Palpations: Abdomen is soft. There is no mass.     Comments: RLQ tenderness. No masses or distention.  Musculoskeletal:     Right lower leg: No edema.     Left lower leg: No edema.     Comments: Tenderness to palpation of lower paraspinal muscles. No spinal tenderness to palpation. Sensation intact of BL LE. 5/5 strength in BL LE. +2 pedal pulse.  Skin:    General: Skin is warm and dry.     Findings: No rash.  Neurological:     General: No focal deficit present.     Mental Status: She is alert. Mental status is at baseline.  Psychiatric:        Mood and Affect: Mood normal.     ED Results / Procedures / Treatments   Labs (all labs ordered are listed, but only abnormal results are displayed) Labs Reviewed  CBC  LIPASE, BLOOD  COMPREHENSIVE METABOLIC PANEL  URINALYSIS, ROUTINE W REFLEX MICROSCOPIC  PREGNANCY, URINE    EKG  None  Radiology No results found.  Procedures Procedures    Medications Ordered in ED Medications - No data to display  ED Course/ Medical Decision Making/ A&P                                 Medical Decision Making Amount and/or Complexity of Data Reviewed Labs: ordered. Radiology: ordered.  Risk Prescription drug management.   This patient presents to the ED for concern of back pain, this involves an extensive number of treatment options, and is a complaint that carries with it a high risk of complications and morbidity.  The differential diagnosis includes spinal abscess, osteomyelitis, herniated disc, muscle strain, spinal fracture, meningitis, cancer, cauda equina syndrome, gastroenteritis,  colitis, small bowel obstruction, appendicitis, cholecystitis, pancreatitis, nephrolithiasis, UTI, pyleonephritis, ruptured ectopic pregnancy, PID, ovarian torsion.   Co morbidities that complicate the patient evaluation  none   Additional history obtained:  Patient seen at [facility] yesterday for same symptoms and referred to ED   Lab Tests:  I Ordered, and personally interpreted labs.  The pertinent results include:   - lipase: within normal limits - CBC: No concern for anemia or leukocytosis - UPT: negative: - CMP: mild hypokalemia at 3.4 - UA: not concerning for infection   Imaging Studies ordered:  I ordered imaging studies including  -CT Abd/Pelvis with contrast: evaluate for structural/surgical etiology of patients' severe abdominal pain.  I independently visualized and interpreted imaging  I agree with the radiologist interpretation   Problem List / ED Course / Critical interventions / Medication management  Patient presents to ED concern for RLQ abdominal pain x1 week.  Patient denying any infectious symptoms such as vomiting, hematochezia or diarrhea.  Physical exam with tenderness to palpation of RLQ.  Patient afebrile with stable vitals. Lipase mildly elevated at 52.  CBC without leukocytosis or anemia.  CMP with mild hypokalemia 3.4-but is otherwise reassuring.  UA without concern for infection.  CT without acute concerns. Per chart review, patient's urgent care note yesterday states that she has been dealing with intermittent constipation and diarrhea.  This is concerning for IBS, which may be causing her symptoms.  Recommended patient follow-up with a GI provider.  Provided GI information in discharge paperwork.  Also recommend following up with PCP in the next couple days.  Patient endorsed understanding of plan. As for patient's back pain  - patient denies urinary retention, fecal incontinence, saddle anesthesia, lower extremity weakness, hx of cancer, fever,  immunosuppression, IVDU, spinal procedure, significant trauma. Will proceed with conservative therapy and have patient follow up with PCP. I have reviewed the patients home medicines and have made adjustments as needed Patient afebrile with stable vitals. Patient ready for discharge. Provided patient with  return precaution.  Ddx: these are considered less likely due to history of present illness and physical exam -spinal abscess: no fever, no skin findings, no history of IVDU -osteomyelitis: no history of IVDU; pain is acute -spinal fracture: not a high force trauma associated with pain -meningitis: no fever; lack of meningism symptoms -cancer: symptoms are acute -cauda equina syndrome: denies saddle paresthesia, urinary retention, fecal incontinence -gastroenteritis: No vomiting in ED; no fever; tolerating PO intake -colitis: Denies diarrhea, patient afebrile  -small bowel obstruction: Last BM yesterday  -appendicitis: CT without concern -cholecystitis: CT without concern liver enzymes within normal limits  -pancreatitis: CT without concern -nephrolithiasis: Denies flank pain and urinary complaints  -UTI/pyelonephritis: Denies urinary  complaints  -ruptured ectopic pregnancy, PID, ovarian torsion: UPT negative; denies pelvic pain  Social Determinants of Health:  none              Final Clinical Impression(s) / ED Diagnoses Final diagnoses:  None    Rx / DC Orders ED Discharge Orders     None         Margarita Rana 04/17/23 0021    Rondel Baton, MD 04/17/23 1215

## 2023-04-16 NOTE — ED Notes (Signed)
Pt had UA at [facility] and is unable to urinate again at this time.

## 2023-04-16 NOTE — ED Triage Notes (Signed)
Sent from [facility] for eval of lower back pain and RLQ pain since last Thursday. No vomiting.  Feels "acid reflux"  Diarrhea and constipation.  No fever or chills.

## 2023-04-16 NOTE — ED Notes (Signed)
Patient is being discharged from the Urgent Care and sent to the Emergency Department via private vehicle . Per NP, patient is in need of higher level of care due to right lower quad tenderness. Patient is aware and verbalizes understanding of plan of care.  Vitals:   04/16/23 1632  BP: (!) 149/92  Pulse: 88  Resp: 18  Temp: 98.6 F (37 C)  SpO2: 98%

## 2023-04-16 NOTE — Discharge Instructions (Addendum)
It was a pleasure caring for you today.  Imaging workup was reassuring.  Please follow-up with the GI provider in his discharge paperwork. Please call them first thing in the morning.  Also please follow-up with your primary care provider in the next 48-72 hours.  Seek emergency care if experiencing any new or worsening symptoms.  Alternating between 650 mg Tylenol and 400 mg Advil: The best way to alternate taking Acetaminophen (example Tylenol) and Ibuprofen (example Advil/Motrin) is to take them 3 hours apart. For example, if you take ibuprofen at 6 am you can then take Tylenol at 9 am. You can continue this regimen throughout the day, making sure you do not exceed the recommended maximum dose for each drug.

## 2023-04-16 NOTE — ED Triage Notes (Signed)
Pt reports lower back pain and lower right side abdominal pain x 1 week States her BM are not consistent. Frequent urination

## 2023-04-29 ENCOUNTER — Encounter: Payer: Self-pay | Admitting: Emergency Medicine

## 2023-04-29 ENCOUNTER — Other Ambulatory Visit: Payer: Self-pay

## 2023-04-29 ENCOUNTER — Ambulatory Visit
Admission: EM | Admit: 2023-04-29 | Discharge: 2023-04-29 | Disposition: A | Discharge status: Home / Self Care | Payer: Medicaid Other | Attending: Nurse Practitioner | Admitting: Nurse Practitioner

## 2023-04-29 DIAGNOSIS — R159 Full incontinence of feces: Secondary | ICD-10-CM | POA: Diagnosis not present

## 2023-04-29 DIAGNOSIS — R109 Unspecified abdominal pain: Secondary | ICD-10-CM

## 2023-04-29 DIAGNOSIS — R197 Diarrhea, unspecified: Secondary | ICD-10-CM | POA: Diagnosis not present

## 2023-04-29 MED ORDER — ALUM & MAG HYDROXIDE-SIMETH 200-200-20 MG/5ML PO SUSP
30.0000 mL | Freq: Once | ORAL | Status: AC
  Administered 2023-04-29: 30 mL via ORAL

## 2023-04-29 NOTE — Discharge Instructions (Addendum)
We gave you Maalox/Mylanta today to help with the pain your abdomen.  We checked some blood work and will contact you tomorrow if the results are abnormal.  In the meantime, you can try OTC Immodium to help with the diarrhea.    Please call Dr. Haywood Pao office to schedule follow up if symptoms persist.

## 2023-04-29 NOTE — ED Triage Notes (Addendum)
Pt reports was seen for similar last month and seen at UC,ED,and GI specialist. Pt reports most recent flare-up has lasted 3 weeks, unable to control bowels,nausea for last several days.  Reports GI specialist gave pt medication but reports nothing helps with pain, otc medication doesn't help with loose BM's either. Pt reports alternates between constipation and diarrhea.

## 2023-04-29 NOTE — ED Provider Notes (Signed)
[facility]    CSN: [id number] Arrival date & time: 04/29/23  1603      History   Chief Complaint Chief Complaint  Patient presents with   Diarrhea    HPI Kathleen Miles is a 22 y.o. female.   Patient presents today with 3 week history of abdominal pain.  She reports the pain is in the epigastric area constantly and has not gotten worse since she has seen her GI specialist.  She was initially seen in the ER on 04/16/23 and had a CT Abd/Pelvis that did not show acute abnormalities.  She states she has since seen Dr. Elnoria Howard with GI who started her on prednisone.  Reports the prednisone helped with the abdominal pain until it ran out.  She is most concerned about diarrhea that has been ongoing for the past 3 days.  She reports she has had multiple "uncontrolled BM" and "leaking poop" and has had 1 episode of significant fecal incontinence.  Denies more than 10 loose bowel movements per day or blood in the stool.  No unexplained weight loss recently.  States she is sometimes unaware when she is having a BM for the past 3 days.  Has had some nausea, however no vomiting or fevers.  She does endorse waking up at night with sweating.  States she is eating and drinking normally.  No burning with urination, increased urinary frequency, or urgency.   Has tried Pepto Bismol for the pain and diarrhea without relief.   Patient states she has had "stomach issues" for a number of years.    History reviewed. No pertinent past medical history.  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 No pertinent surgical history.  OB History   No obstetric history on file.      Home Medications    Prior to Admission medications   Medication Sig Start Date End Date Taking? Authorizing Provider  norethindrone-ethinyl estradiol-iron  (JUNEL FE 1.5/30) 1.5-30 MG-MCG tablet Take 1 tablet by mouth daily. 12/09/22   Donita Brooks, MD    Family History Family History  Problem Relation Age of Onset   HIV Father     Social History Social History   Tobacco Use   Smoking status: Never   Smokeless tobacco: Never  Substance Use Topics   Alcohol use: No   Drug use: No     Allergies   Patient has no known allergies.   Review of Systems Review of Systems Per HPI  Physical Exam Triage Vital Signs ED Triage Vitals  Encounter Vitals Group     BP 04/29/23 1616 114/74     Systolic BP Percentile --      Diastolic BP Percentile --      Pulse Rate 04/29/23 1616 69     Resp 04/29/23 1616 20     Temp 04/29/23 1616 97.9 F (36.6 C)     Temp Source 04/29/23 1616 Oral     SpO2 04/29/23 1616 91 %     Weight --      Height --      Head Circumference --      Peak Flow --      Pain Score 04/29/23 1614 7     Pain Loc --      Pain Education --      Exclude from Growth Chart --  No data found.  Updated Vital Signs BP 114/74 (BP Location: Right Arm)   Pulse 69   Temp 97.9 F (36.6 C) (Oral)   Resp 20   LMP 04/29/2023 (Approximate)   SpO2 91%   Visual Acuity Right Eye Distance:   Left Eye Distance:   Bilateral Distance:    Right Eye Near:   Left Eye Near:    Bilateral Near:     Physical Exam Vitals and nursing note reviewed.  Constitutional:      General: She is not in acute distress.    Appearance: Normal appearance. She is not toxic-appearing.  HENT:     Head: Normocephalic and atraumatic.     Mouth/Throat:     Mouth: Mucous membranes are moist.     Pharynx: Oropharynx is clear.  Eyes:     General: No scleral icterus.    Extraocular Movements: Extraocular movements intact.  Cardiovascular:     Rate and Rhythm: Normal rate and regular rhythm.  Pulmonary:     Effort: Pulmonary effort is normal. No respiratory distress.     Breath sounds: Normal breath sounds. No wheezing, rhonchi or rales.   Abdominal:     General: Abdomen is flat. Bowel sounds are increased. There is no distension.     Palpations: Abdomen is soft.     Tenderness: There is generalized abdominal tenderness. There is no guarding.  Musculoskeletal:     Cervical back: Normal range of motion.  Lymphadenopathy:     Cervical: No cervical adenopathy.  Skin:    General: Skin is warm and dry.     Capillary Refill: Capillary refill takes less than 2 seconds.     Coloration: Skin is not jaundiced or pale.     Findings: No erythema.  Neurological:     Mental Status: She is alert and oriented to person, place, and time.  Psychiatric:        Behavior: Behavior is cooperative.      UC Treatments / Results  Labs (all labs ordered are listed, but only abnormal results are displayed) Labs Reviewed  CBC  COMPREHENSIVE METABOLIC PANEL  LIPASE    EKG   Radiology No results found.  Procedures Procedures (including critical care time)  Medications Ordered in UC Medications  alum & mag hydroxide-simeth (MAALOX/MYLANTA) 200-200-20 MG/5ML suspension 30 mL (30 mLs Oral Given 04/29/23 1650)    Initial Impression / Assessment and Plan / UC Course  I have reviewed the triage vital signs and the nursing notes.  Pertinent labs & imaging results that were available during my care of the patient were reviewed by me and considered in my medical decision making (see chart for details).   Patient is well-appearing, normotensive, afebrile, not tachycardic, not tachypneic, oxygenating well on room air.    1. Abdominal pain, unspecified abdominal location 2. Diarrhea, unspecified type 3. Incontinence of feces, unspecified fecal incontinence type Overall, patient is well appearing and vital signs are stable No red flags or reason for emergent evaluation today; patients abdomen is generally tender but no guarding, negative Murphy's/McBurney's sign Query possible viral gastroenteritis on top of chronic abdominal pain GI  cocktail given today for epigastric pain Start OTC Immodium Recommended close follow up with GI if symptoms not improved in next couple of days Strict ER precautions discussed with patient Work excuse provided today  The patient was given the opportunity to ask questions.  All questions answered to their satisfaction.  The patient is in agreement to this plan.  Final Clinical Impressions(s) / UC Diagnoses   Final diagnoses:  Abdominal pain, unspecified abdominal location  Diarrhea, unspecified type  Incontinence of feces, unspecified fecal incontinence type     Discharge Instructions      We gave you Maalox/Mylanta today to help with the pain your abdomen.  We checked some blood work and will contact you tomorrow if the results are abnormal.  In the meantime, you can try OTC Immodium to help with the diarrhea.    Please call Dr. Haywood Pao office to schedule follow up if symptoms persist.      ED Prescriptions   None    PDMP not reviewed this encounter.   Valentino Nose, NP 04/29/23 2004

## 2023-04-30 ENCOUNTER — Encounter (HOSPITAL_COMMUNITY): Payer: Self-pay | Admitting: *Deleted

## 2023-04-30 ENCOUNTER — Emergency Department (HOSPITAL_COMMUNITY)
Admission: EM | Admit: 2023-04-30 | Discharge: 2023-04-30 | Disposition: A | Discharge status: Home / Self Care | Payer: Medicaid Other | Attending: Emergency Medicine | Admitting: Emergency Medicine

## 2023-04-30 ENCOUNTER — Other Ambulatory Visit: Payer: Self-pay

## 2023-04-30 DIAGNOSIS — R1013 Epigastric pain: Secondary | ICD-10-CM | POA: Diagnosis not present

## 2023-04-30 DIAGNOSIS — R197 Diarrhea, unspecified: Secondary | ICD-10-CM | POA: Diagnosis not present

## 2023-04-30 DIAGNOSIS — R110 Nausea: Secondary | ICD-10-CM | POA: Insufficient documentation

## 2023-04-30 DIAGNOSIS — R109 Unspecified abdominal pain: Secondary | ICD-10-CM | POA: Diagnosis present

## 2023-04-30 DIAGNOSIS — R3121 Asymptomatic microscopic hematuria: Secondary | ICD-10-CM | POA: Insufficient documentation

## 2023-04-30 LAB — CBC
Hematocrit: 39.8 % (ref 34.0–46.6)
Hemoglobin: 13.2 g/dL (ref 11.1–15.9)
MCH: 32.7 pg (ref 26.6–33.0)
MCHC: 33.2 g/dL (ref 31.5–35.7)
MCV: 99 fL — ABNORMAL HIGH (ref 79–97)
Platelets: 248 10*3/uL (ref 150–450)
RBC: 4.04 x10E6/uL (ref 3.77–5.28)
RDW: 11.9 % (ref 11.7–15.4)
WBC: 5.9 10*3/uL (ref 3.4–10.8)

## 2023-04-30 LAB — COMPREHENSIVE METABOLIC PANEL
ALT: 13 [IU]/L (ref 0–32)
ALT: 15 U/L (ref 0–44)
AST: 15 [IU]/L (ref 0–40)
AST: 15 U/L (ref 15–41)
Albumin: 4.4 g/dL (ref 4.0–5.0)
Albumin: 4.1 g/dL (ref 3.5–5.0)
Alkaline Phosphatase: 45 [IU]/L (ref 44–121)
Alkaline Phosphatase: 33 U/L — ABNORMAL LOW (ref 38–126)
Anion gap: 10 (ref 5–15)
BUN/Creatinine Ratio: 22 (ref 9–23)
BUN: 11 mg/dL (ref 6–20)
BUN: 9 mg/dL (ref 6–20)
Bilirubin Total: 0.3 mg/dL (ref 0.0–1.2)
CO2: 24 mmol/L (ref 20–29)
CO2: 24 mmol/L (ref 22–32)
Calcium: 9.2 mg/dL (ref 8.7–10.2)
Calcium: 8.6 mg/dL — ABNORMAL LOW (ref 8.9–10.3)
Chloride: 104 mmol/L (ref 96–106)
Chloride: 102 mmol/L (ref 98–111)
Creatinine, Ser: 0.5 mg/dL — ABNORMAL LOW (ref 0.57–1.00)
Creatinine, Ser: 0.45 mg/dL (ref 0.44–1.00)
GFR, Estimated: >60 mL/min (ref >60)
Globulin, Total: 3 g/dL (ref 1.5–4.5)
Glucose, Bld: 91 mg/dL (ref 70–99)
Glucose: 114 mg/dL — ABNORMAL HIGH (ref 70–99)
Potassium: 4.2 mmol/L (ref 3.5–5.2)
Potassium: 3.5 mmol/L (ref 3.5–5.1)
Sodium: 140 mmol/L (ref 134–144)
Sodium: 136 mmol/L (ref 135–145)
Total Bilirubin: 0.7 mg/dL (ref 0.3–1.2)
Total Protein: 7.4 g/dL (ref 6.0–8.5)
Total Protein: 7.3 g/dL (ref 6.5–8.1)
eGFR: 136 mL/min/{1.73_m2} (ref >59)

## 2023-04-30 LAB — URINALYSIS, ROUTINE W REFLEX MICROSCOPIC
Bilirubin Urine: NEGATIVE
Glucose, UA: NEGATIVE mg/dL
Ketones, ur: NEGATIVE mg/dL
Leukocytes,Ua: NEGATIVE
Nitrite: NEGATIVE
Protein, ur: NEGATIVE mg/dL
Specific Gravity, Urine: 1.027 (ref 1.005–1.030)
pH: 6 (ref 5.0–8.0)

## 2023-04-30 LAB — CBC WITH DIFFERENTIAL/PLATELET
Abs Immature Granulocytes: 0.01 10*3/uL (ref 0.00–0.07)
Basophils Absolute: 0.1 10*3/uL (ref 0.0–0.1)
Basophils Relative: 1 %
Eosinophils Absolute: 0.1 10*3/uL (ref 0.0–0.5)
Eosinophils Relative: 3 %
HCT: 39 % (ref 36.0–46.0)
Hemoglobin: 13.3 g/dL (ref 12.0–15.0)
Immature Granulocytes: 0 %
Lymphocytes Relative: 46 %
Lymphs Abs: 2.3 10*3/uL (ref 0.7–4.0)
MCH: 32.8 pg (ref 26.0–34.0)
MCHC: 34.1 g/dL (ref 30.0–36.0)
MCV: 96.1 fL (ref 80.0–100.0)
Monocytes Absolute: 0.3 10*3/uL (ref 0.1–1.0)
Monocytes Relative: 7 %
Neutro Abs: 2.2 10*3/uL (ref 1.7–7.7)
Neutrophils Relative %: 43 %
Platelets: 193 10*3/uL (ref 150–400)
RBC: 4.06 MIL/uL (ref 3.87–5.11)
RDW: 12.3 % (ref 11.5–15.5)
WBC: 5 10*3/uL (ref 4.0–10.5)
nRBC: 0 % (ref 0.0–0.2)

## 2023-04-30 LAB — PREGNANCY, URINE: Preg Test, Ur: NEGATIVE

## 2023-04-30 LAB — LIPASE: Lipase: 38 U/L (ref 14–72)

## 2023-04-30 LAB — LIPASE, BLOOD: Lipase: 39 U/L (ref 11–51)

## 2023-04-30 LAB — HCG, SERUM, QUALITATIVE: Preg, Serum: NEGATIVE

## 2023-04-30 MED ORDER — ONDANSETRON HCL 4 MG/2ML IJ SOLN
4.0000 mg | Freq: Once | INTRAMUSCULAR | Status: AC
  Administered 2023-04-30: 4 mg via INTRAVENOUS
  Filled 2023-04-30: qty 2

## 2023-04-30 MED ORDER — DICYCLOMINE HCL 20 MG PO TABS: 20.0000 mg | ORAL_TABLET | Freq: Two times a day (BID) | ORAL | 0 refills | Status: DC

## 2023-04-30 MED ORDER — FAMOTIDINE 20 MG PO TABS: 20.0000 mg | ORAL_TABLET | Freq: Two times a day (BID) | ORAL | 0 refills | Status: DC

## 2023-04-30 MED ORDER — ONDANSETRON HCL 4 MG PO TABS: 4.0000 mg | ORAL_TABLET | Freq: Four times a day (QID) | ORAL | 0 refills | Status: DC

## 2023-04-30 MED ORDER — ALUM & MAG HYDROXIDE-SIMETH 200-200-20 MG/5ML PO SUSP
30.0000 mL | Freq: Once | ORAL | Status: AC
  Administered 2023-04-30: 30 mL via ORAL
  Filled 2023-04-30: qty 30

## 2023-04-30 MED ORDER — FAMOTIDINE 20 MG PO TABS
20.0000 mg | ORAL_TABLET | Freq: Once | ORAL | Status: AC
  Administered 2023-04-30: 20 mg via ORAL
  Filled 2023-04-30: qty 1

## 2023-04-30 MED ORDER — DICYCLOMINE HCL 10 MG PO CAPS
10.0000 mg | ORAL_CAPSULE | Freq: Once | ORAL | Status: AC
  Administered 2023-04-30: 10 mg via ORAL
  Filled 2023-04-30: qty 1

## 2023-04-30 MED ORDER — MORPHINE SULFATE (PF) 4 MG/ML IV SOLN
4.0000 mg | Freq: Once | INTRAVENOUS | Status: AC
  Administered 2023-04-30: 4 mg via INTRAVENOUS
  Filled 2023-04-30: qty 1

## 2023-04-30 NOTE — ED Triage Notes (Addendum)
Pain in rt abd around to rt flank. Nausea reported, pt states she is having watery yellow stools

## 2023-04-30 NOTE — ED Notes (Signed)
Pt discharged home. Discharge information discussed. No s/s of distress observed during discharge. 

## 2023-04-30 NOTE — Discharge Instructions (Signed)
You were seen in the emergency department for your abdominal pain. Your work up showed no signs of infection or dehydration.  You did have microscopic blood in your urine without signs of urinary tract infection.  It is unclear what is causing your symptoms at this time but you should take Pepcid every day for the next 1 to 2 weeks to see if this helps with your symptoms and you can take Bentyl as needed for additional pain and Zofran as needed for nausea.  You should follow-up with your GI doctor in the next few days to have your symptoms rechecked and to determine if you need further workup.  You should also follow-up with the urologist for the blood in your urine.  You should return to the emergency department for fevers, repetitive vomiting, significantly worsening pain, black or bloody stools or any other new or concerning symptoms.

## 2023-04-30 NOTE — ED Provider Notes (Signed)
[place] EMERGENCY DEPARTMENT AT [facility] Provider Note   CSN: [id number] Arrival date & time: 04/30/23  0865     History  Chief Complaint  Patient presents with   Abdominal Pain    Kathleen Miles is a 22 y.o. female.  Patient is a 22 year old female with no significant past medical history presenting to the emergency department with abdominal pain, nausea and diarrhea.  The patient states that she has had right sided abdominal pain with associated epigastric pain for the last 3 weeks.  She was initially seen in the ED and had normal labs and CT.  She states that she was seen by GI and was started on a steroid course which initially helped however over the last few days the pain and diarrhea is worsened.  She states that she was seen at urgent care yesterday and was started on Imodium which helped with the diarrhea but the pain has not improved.  Patient denies any fevers, dysuria or hematuria, vaginal bleeding or discharge.  She denies any black or bloody stools.  She denies any recent antibiotic use or travel.  She states a family member had similar symptoms and had their gallbladder taken out but she was not found to have any gallbladder disease on her previous imaging.  The history is provided by the patient and medical records.  Abdominal Pain      Home Medications Prior to Admission medications   Medication Sig Start Date End Date Taking? Authorizing Provider  dicyclomine (BENTYL) 20 MG tablet Take 1 tablet (20 mg total) by mouth 2 (two) times daily. 04/30/23  Yes Theresia Lo, Kaniya Trueheart K, DO  famotidine (PEPCID) 20 MG tablet Take 1 tablet (20 mg total) by mouth 2 (two) times daily. 04/30/23  Yes Theresia Lo, [place] K, DO  methylPREDNISolone (MEDROL) 4 MG tablet Take 4 mg by mouth daily.   Yes [provider]  norethindrone-ethinyl estradiol-iron (JUNEL FE 1.5/30) 1.5-30 MG-MCG tablet Take 1 tablet by mouth daily. 12/09/22  Yes Donita Brooks, MD   ondansetron (ZOFRAN) 4 MG tablet Take 1 tablet (4 mg total) by mouth every 6 (six) hours. 04/30/23  Yes Elayne Snare K, DO      Allergies    Patient has no known allergies.    Review of Systems   Review of Systems  Gastrointestinal:  Positive for abdominal pain.    Physical Exam Updated Vital Signs BP 127/89 (BP Location: Left Arm)   Pulse (!) 58   Temp 97.7 F (36.5 C) (Oral)   Resp 16   Ht 5' (1.524 m)   Wt 52.6 kg   LMP 04/29/2023 (Approximate)   SpO2 99%   BMI 22.65 kg/m  Physical Exam Vitals and nursing note reviewed.  Constitutional:      General: She is not in acute distress.    Appearance: She is well-developed.  HENT:     Head: Normocephalic and atraumatic.     Mouth/Throat:     Mouth: Mucous membranes are moist.  Eyes:     Extraocular Movements: Extraocular movements intact.  Cardiovascular:     Rate and Rhythm: Normal rate and regular rhythm.     Heart sounds: Normal heart sounds.  Pulmonary:     Effort: Pulmonary effort is normal.     Breath sounds: Normal breath sounds.  Abdominal:     General: Abdomen is flat.     Palpations: Abdomen is soft.     Tenderness: There is abdominal tenderness in the right upper quadrant,  right lower quadrant and epigastric area. There is no guarding or rebound.  Skin:    General: Skin is warm and dry.  Neurological:     General: No focal deficit present.     Mental Status: She is alert and oriented to person, place, and time.  Psychiatric:        Mood and Affect: Mood normal.        Behavior: Behavior normal.     ED Results / Procedures / Treatments   Labs (all labs ordered are listed, but only abnormal results are displayed) Labs Reviewed  COMPREHENSIVE METABOLIC PANEL - Abnormal; Notable for the following components:      Result Value   Calcium 8.6 (*)    Alkaline Phosphatase 33 (*)    All other components within normal limits  URINALYSIS, ROUTINE W REFLEX MICROSCOPIC - Abnormal; Notable for the  following components:   Hgb urine dipstick MODERATE (*)    Bacteria, UA RARE (*)    All other components within normal limits  CBC WITH DIFFERENTIAL/PLATELET  PREGNANCY, URINE  LIPASE, BLOOD  HCG, SERUM, QUALITATIVE    EKG None  Radiology No results found.  Procedures Procedures    Medications Ordered in ED Medications  morphine (PF) 4 MG/ML injection 4 mg (4 mg Intravenous Given 04/30/23 1106)  ondansetron (ZOFRAN) injection 4 mg (4 mg Intravenous Given 04/30/23 1104)  dicyclomine (BENTYL) capsule 10 mg (10 mg Oral Given 04/30/23 1106)  famotidine (PEPCID) tablet 20 mg (20 mg Oral Given 04/30/23 1106)  alum & mag hydroxide-simeth (MAALOX/MYLANTA) 200-200-20 MG/5ML suspension 30 mL (30 mLs Oral Given 04/30/23 1106)    ED Course/ Medical Decision Making/ A&P Clinical Course as of 04/30/23 1236  Thu Apr 30, 2023  1208 Hematuria on urine, otherwise normal. No elevated WBC count and no clinical signs concerning for appendicitis with recent negative CT so does not need repeat imaging today.  [VK]  1232 Upon reassessment patient's pain has resolved. Reports she is not on her period and did have hematuria on last visit without evidence of kidney stone. Will be recommended outpatient urology as well has GI follow up. She was given strict return precautions. [VK]    Clinical Course User Index [VK] Rexford Maus, DO                                 Medical Decision Making This patient presents to the ED with chief complaint(s) of abdominal pain with no pertinent past medical history which further complicates the presenting complaint. The complaint involves an extensive differential diagnosis and also carries with it a high risk of complications and morbidity.    The differential diagnosis includes gastroenteritis, gastritis, GERD, infectious diarrhea, IBD, dehydration, electrolyte abnormality, UTI, pregnancy, no evidence of gallbladder disease making cholelithiasis or cholecystitis  unlikely, recent normal-appearing appendix without any fevers or worsening symptoms making appendicitis or other intra-abdominal infection less likely  Additional history obtained: Additional history obtained from N/A Records reviewed urgent care and recent ED records  ED Course and Reassessment: On patient's arrival she is hemodynamically stable in no acute distress.  She does have mild diffuse tenderness to palpation, worse in the epigastrium and right side of her abdomen.  Patient will have repeat labs performed and she will be given pain and nausea control and will be closely reassessed.  She reports that she has not had any diarrhea since taking Imodium yesterday but will send  stool studies if she does have a BM here.  Independent labs interpretation:  The following labs were independently interpreted: hematuria, otherwise within normal range  Independent visualization of imaging: -N/A  Consultation: - Consulted or discussed management/test interpretation w/ external professional: N/A  Consideration for admission or further workup: Patient has no emergent conditions requiring admission or further work-up at this time and is stable for discharge home with primary care, urology, GI follow-up  Social Determinants of health: N/A    Amount and/or Complexity of Data Reviewed Labs: ordered.  Risk OTC drugs. Prescription drug management.          Final Clinical Impression(s) / ED Diagnoses Final diagnoses:  Abdominal pain, unspecified abdominal location  Asymptomatic microscopic hematuria    Rx / DC Orders ED Discharge Orders          Ordered    dicyclomine (BENTYL) 20 MG tablet  2 times daily        04/30/23 1233    famotidine (PEPCID) 20 MG tablet  2 times daily        04/30/23 1233    ondansetron (ZOFRAN) 4 MG tablet  Every 6 hours        04/30/23 1233              Rexford Maus, DO 04/30/23 1236

## 2023-05-08 ENCOUNTER — Ambulatory Visit: Payer: Medicaid Other | Admitting: Family Medicine

## 2023-05-08 ENCOUNTER — Encounter: Payer: Self-pay | Admitting: Family Medicine

## 2023-05-08 VITALS — BP 128/76 | HR 82 | Temp 98.5°F | Ht 60.0 in | Wt 116.0 lb

## 2023-05-08 DIAGNOSIS — R1030 Lower abdominal pain, unspecified: Secondary | ICD-10-CM

## 2023-05-08 LAB — URINALYSIS, ROUTINE W REFLEX MICROSCOPIC
Bilirubin Urine: NEGATIVE
Glucose, UA: NEGATIVE
Hyaline Cast: NONE SEEN /[LPF]
Ketones, ur: NEGATIVE
Leukocytes,Ua: NEGATIVE
Nitrite: NEGATIVE
Protein, ur: NEGATIVE
Specific Gravity, Urine: 1.021 (ref 1.001–1.035)
pH: 6 (ref 5.0–8.0)

## 2023-05-08 LAB — MICROSCOPIC MESSAGE

## 2023-05-08 NOTE — Progress Notes (Signed)
Subjective:    Patient ID: Kathleen Miles, female    DOB: 26-May-2001, 22 y.o.   MRN: [id number]  Abdominal Pain  Patient recently has been in the emergency room due to stomach issues.  She states that after she ate a meal a few weeks ago, she developed crampy abdominal pain and intense diarrhea.  She went to the emergency room in September.  A CT scan of the abdomen and pelvis was normal.  Urinalysis did show hematuria.  She was given dicyclomine.  She has been taking dicyclomine which has helped with stomach cramps immensely.  She is also been taking Xifaxan which was prescribed by her gastroenterologist for irritable bowel syndrome.  She is no longer having diarrhea.  However now she is having pain with defecation.  She reports that it is hard for her to have a bowel movement.  Is having small, stool balls.  She does not feel like she is completely evacuating her rectum when she defecates.  She denies any melena or hematochezia.  She denies any fevers or chills.  No past medical history on file. No past surgical history on file. Current Outpatient Medications on File Prior to Visit  Medication Sig Dispense Refill   dicyclomine (BENTYL) 20 MG tablet Take 1 tablet (20 mg total) by mouth 2 (two) times daily. 20 tablet 0   famotidine (PEPCID) 20 MG tablet Take 1 tablet (20 mg total) by mouth 2 (two) times daily. 30 tablet 0   norethindrone-ethinyl estradiol-iron (JUNEL FE 1.5/30) 1.5-30 MG-MCG tablet Take 1 tablet by mouth daily. 30 tablet 11   ondansetron (ZOFRAN) 4 MG tablet Take 1 tablet (4 mg total) by mouth every 6 (six) hours. 12 tablet 0   sulfamethoxazole-trimethoprim (BACTRIM DS) 800-160 MG tablet Take 1 tablet by mouth 2 (two) times daily.     XIFAXAN 550 MG TABS tablet Take 550 mg by mouth 3 (three) times daily.     methylPREDNISolone (MEDROL) 4 MG tablet Take 4 mg by mouth daily. (Patient not taking: Reported on 05/08/2023)     No current facility-administered medications on  file prior to visit.     No Known Allergies     Review of Systems  Gastrointestinal:  Positive for abdominal pain.  All other systems reviewed and are negative.      Objective:   Physical Exam Vitals reviewed.  Constitutional:      General: She is not in acute distress.    Appearance: Normal appearance. She is not ill-appearing or toxic-appearing.  Cardiovascular:     Heart sounds: Normal heart sounds.  Pulmonary:     Breath sounds: Normal breath sounds.  Abdominal:     General: Bowel sounds are normal. There is no distension.     Palpations: Abdomen is soft. There is no shifting dullness, hepatomegaly, splenomegaly or mass.     Tenderness: There is no abdominal tenderness. There is no guarding or rebound. Negative signs include Murphy's sign and McBurney's sign.  Neurological:     Mental Status: She is alert.           Assessment & Plan:  Lower abdominal pain - Plan: Urinalysis, Routine w reflex microscopic Urinalysis shows persistent hematuria.  She is seeing a urologist for this.  They currently have her on Bactrim for urinary tract infection.  I have recommended that she follow-up with urology after the antibiotic to repeat a urinalysis.  If hematuria persist she may require cystoscopy.  I believe the abdominal pain now is secondary  to constipation.  I recommended that she stop the dicyclomine if she is no longer having intestinal cramps as this may exacerbate constipation.  I gave the patient samples of Linzess 72 mcg p.o. daily to take as a stool softener to help with constipation.

## 2023-06-11 DIAGNOSIS — M25571 Pain in right ankle and joints of right foot: Secondary | ICD-10-CM | POA: Insufficient documentation

## 2023-06-11 DIAGNOSIS — M7661 Achilles tendinitis, right leg: Secondary | ICD-10-CM | POA: Insufficient documentation

## 2023-07-02 DIAGNOSIS — M79671 Pain in right foot: Secondary | ICD-10-CM | POA: Insufficient documentation

## 2023-08-06 ENCOUNTER — Ambulatory Visit: Payer: Medicaid Other | Admitting: Family Medicine

## 2023-08-06 VITALS — BP 120/62 | HR 78 | Temp 98.9°F

## 2023-08-06 DIAGNOSIS — R1030 Lower abdominal pain, unspecified: Secondary | ICD-10-CM

## 2023-08-06 MED ORDER — CRYSELLE-28 0.3-30 MG-MCG PO TABS: 1.0000 | ORAL_TABLET | Freq: Every day | ORAL | 11 refills | Status: DC

## 2023-08-06 NOTE — Progress Notes (Signed)
   Subjective:    Patient ID: Kathleen Miles, female    DOB: 03-13-2001, 22 y.o.   MRN: [id number] Patient is a 23 year old Hispanic female who is currently on Junel for birth control.  She states that the Junel is causing abdominal cramps.  She is convinced that the birth control is the cause.  She stopped the Janel for 1 month of the cramps subsided it went away.  She wants to switch back to her previous birth control which was Cryselle .  I explained that this has the exact same strength and dose of ethynyl estradiol .  However the progesterone is different.  I am not sure that this is really causing her abdominal pain but the patient would like to try this switch first.  We also discussed NuvaRing however she is hesitant to switch to this   No past medical history on file. No past surgical history on file. Current Outpatient Medications on File Prior to Visit  Medication Sig Dispense Refill   dicyclomine  (BENTYL ) 20 MG tablet Take 1 tablet (20 mg total) by mouth 2 (two) times daily. 20 tablet 0   famotidine  (PEPCID ) 20 MG tablet Take 1 tablet (20 mg total) by mouth 2 (two) times daily. 30 tablet 0   methylPREDNISolone  (MEDROL ) 4 MG tablet Take 4 mg by mouth daily. (Patient not taking: Reported on 05/08/2023)     norethindrone -ethinyl estradiol -iron (JUNEL FE 1.5/30) 1.5-30 MG-MCG tablet Take 1 tablet by mouth daily. 30 tablet 11   ondansetron  (ZOFRAN ) 4 MG tablet Take 1 tablet (4 mg total) by mouth every 6 (six) hours. 12 tablet 0   sulfamethoxazole -trimethoprim  (BACTRIM  DS) 800-160 MG tablet Take 1 tablet by mouth 2 (two) times daily.     XIFAXAN 550 MG TABS tablet Take 550 mg by mouth 3 (three) times daily.     No current facility-administered medications on file prior to visit.     No Known Allergies     Review of Systems  Gastrointestinal:  Positive for abdominal pain.  All other systems reviewed and are negative.      Objective:   Physical Exam Vitals reviewed.   Constitutional:      General: She is not in acute distress.    Appearance: Normal appearance. She is not ill-appearing or toxic-appearing.  Cardiovascular:     Heart sounds: Normal heart sounds.  Pulmonary:     Breath sounds: Normal breath sounds.  Abdominal:     General: Bowel sounds are normal. There is no distension.     Palpations: Abdomen is soft. There is no shifting dullness, hepatomegaly, splenomegaly or mass.     Tenderness: There is no abdominal tenderness. There is no guarding or rebound. Negative signs include Murphy's sign and McBurney's sign.  Neurological:     Mental Status: She is alert.           Assessment & Plan:  Lower abdominal pain Per the patient's request, switch Junel to Cryselle  1 pill a day.  Patient could not tolerate Depo-Provera .  She could certainly try NuvaRing.  Patient declines a referral for IUD or Norplant.  Reassess in 1 month.  Consider endometriosis as a potential cause if pain persist versus IBS

## 2023-08-10 ENCOUNTER — Emergency Department (HOSPITAL_COMMUNITY)
Admission: EM | Admit: 2023-08-10 | Discharge: 2023-08-10 | Disposition: A | Discharge status: Home / Self Care | Payer: Medicaid Other

## 2023-08-10 ENCOUNTER — Encounter (HOSPITAL_COMMUNITY): Payer: Self-pay

## 2023-08-10 ENCOUNTER — Emergency Department (HOSPITAL_COMMUNITY): Payer: Medicaid Other

## 2023-08-10 ENCOUNTER — Other Ambulatory Visit: Payer: Self-pay

## 2023-08-10 ENCOUNTER — Ambulatory Visit
Admission: EM | Admit: 2023-08-10 | Discharge: 2023-08-10 | Disposition: A | Discharge status: Home / Self Care | Payer: Medicaid Other

## 2023-08-10 ENCOUNTER — Encounter: Payer: Self-pay | Admitting: Family Medicine

## 2023-08-10 DIAGNOSIS — N83209 Unspecified ovarian cyst, unspecified side: Secondary | ICD-10-CM

## 2023-08-10 DIAGNOSIS — N83299 Other ovarian cyst, unspecified side: Secondary | ICD-10-CM | POA: Diagnosis not present

## 2023-08-10 DIAGNOSIS — R109 Unspecified abdominal pain: Secondary | ICD-10-CM

## 2023-08-10 DIAGNOSIS — R112 Nausea with vomiting, unspecified: Secondary | ICD-10-CM | POA: Diagnosis not present

## 2023-08-10 DIAGNOSIS — R1030 Lower abdominal pain, unspecified: Secondary | ICD-10-CM | POA: Diagnosis present

## 2023-08-10 LAB — COMPREHENSIVE METABOLIC PANEL
ALT: 21 U/L (ref 0–44)
AST: 19 U/L (ref 15–41)
Albumin: 4.2 g/dL (ref 3.5–5.0)
Alkaline Phosphatase: 37 U/L — ABNORMAL LOW (ref 38–126)
Anion gap: 4 — ABNORMAL LOW (ref 5–15)
BUN: 10 mg/dL (ref 6–20)
CO2: 23 mmol/L (ref 22–32)
Calcium: 8.6 mg/dL — ABNORMAL LOW (ref 8.9–10.3)
Chloride: 108 mmol/L (ref 98–111)
Creatinine, Ser: 0.48 mg/dL (ref 0.44–1.00)
GFR, Estimated: >60 mL/min (ref >60)
Glucose, Bld: 96 mg/dL (ref 70–99)
Potassium: 3.8 mmol/L (ref 3.5–5.1)
Sodium: 135 mmol/L (ref 135–145)
Total Bilirubin: 0.7 mg/dL (ref 0.0–1.2)
Total Protein: 7.4 g/dL (ref 6.5–8.1)

## 2023-08-10 LAB — URINALYSIS, ROUTINE W REFLEX MICROSCOPIC
Bilirubin Urine: NEGATIVE
Glucose, UA: NEGATIVE mg/dL
Ketones, ur: NEGATIVE mg/dL
Leukocytes,Ua: NEGATIVE
Nitrite: NEGATIVE
Protein, ur: NEGATIVE mg/dL
Specific Gravity, Urine: 1.03 (ref 1.005–1.030)
pH: 5 (ref 5.0–8.0)

## 2023-08-10 LAB — CBC
HCT: 36.2 % (ref 36.0–46.0)
Hemoglobin: 12.3 g/dL (ref 12.0–15.0)
MCH: 32.1 pg (ref 26.0–34.0)
MCHC: 34 g/dL (ref 30.0–36.0)
MCV: 94.5 fL (ref 80.0–100.0)
Platelets: 201 10*3/uL (ref 150–400)
RBC: 3.83 MIL/uL — ABNORMAL LOW (ref 3.87–5.11)
RDW: 11.9 % (ref 11.5–15.5)
WBC: 9.3 10*3/uL (ref 4.0–10.5)
nRBC: 0 % (ref 0.0–0.2)

## 2023-08-10 LAB — HCG, SERUM, QUALITATIVE: Preg, Serum: NEGATIVE

## 2023-08-10 LAB — LIPASE, BLOOD: Lipase: 30 U/L (ref 11–51)

## 2023-08-10 MED ORDER — OXYCODONE-ACETAMINOPHEN 5-325 MG PO TABS: 1.0000 | ORAL_TABLET | Freq: Four times a day (QID) | ORAL | 0 refills | Status: DC | PRN

## 2023-08-10 MED ORDER — OXYCODONE-ACETAMINOPHEN 5-325 MG PO TABS
1.0000 | ORAL_TABLET | ORAL | Status: DC | PRN
  Administered 2023-08-10: 1 via ORAL
  Filled 2023-08-10: qty 1

## 2023-08-10 MED ORDER — KETOROLAC TROMETHAMINE 15 MG/ML IJ SOLN
15.0000 mg | Freq: Once | INTRAMUSCULAR | Status: AC
  Administered 2023-08-10: 15 mg via INTRAVENOUS
  Filled 2023-08-10: qty 1

## 2023-08-10 MED ORDER — ONDANSETRON 4 MG PO TBDP
4.0000 mg | ORAL_TABLET | Freq: Once | ORAL | Status: AC
  Administered 2023-08-10: 4 mg via ORAL
  Filled 2023-08-10: qty 1

## 2023-08-10 MED ORDER — IOHEXOL 300 MG/ML  SOLN
100.0000 mL | Freq: Once | INTRAMUSCULAR | Status: AC | PRN
  Administered 2023-08-10: 100 mL via INTRAVENOUS

## 2023-08-10 NOTE — ED Notes (Signed)
 Patient is being discharged from the Urgent Care and sent to the Emergency Department via [facility] . Per provider Etta CROME. , patient is in need of higher level of care due to abdominal pain needing further testing done. Patient is aware and verbalizes understanding of plan of care.  Vitals:   08/10/23 1528  BP: 124/77  Pulse: 88  Resp: 15  Temp: 98.6 F (37 C)  SpO2: 98%

## 2023-08-10 NOTE — Discharge Instructions (Signed)
 Go to the ER for further evaluation

## 2023-08-10 NOTE — ED Provider Notes (Signed)
 [place] EMERGENCY DEPARTMENT AT [facility] Provider Note   CSN: [id number] Arrival date & time: 08/10/23  1624     History  Chief Complaint  Patient presents with   Abdominal Pain    Kathleen Miles is a 23 y.o. female.  23 year old female presents emergency department for abdominal pain.  Reports worsening pain over the past 3 days.  Lower abdomen.  Associate with nausea and vomiting.  Did have few episodes of diarrhea.  No fevers chills.  Reports history of GI issues with nausea and vomiting, but never had pain lower abdomen like this.  No dysuria.  No vaginal discharge.  Not currently menstruating   Abdominal Pain      Home Medications Prior to Admission medications   Medication Sig Start Date End Date Taking? Authorizing Provider  oxyCODONE -acetaminophen  (PERCOCET/ROXICET) 5-325 MG tablet Take 1 tablet by mouth every 6 (six) hours as needed for severe pain (pain score 7-10). 08/10/23  Yes Neysa Caron PARAS, DO  norgestrel -ethinyl estradiol  (CRYSELLE -28) 0.3-30 MG-MCG tablet Take 1 tablet by mouth daily. 08/06/23   Duanne Butler DASEN, MD  XIFAXAN 550 MG TABS tablet Take 550 mg by mouth 3 (three) times daily. 05/04/23   [provider]      Allergies    Patient has no known allergies.    Review of Systems   Review of Systems  Gastrointestinal:  Positive for abdominal pain.    Physical Exam Updated Vital Signs BP 108/67   Pulse 63   Temp 98 F (36.7 C)   Resp 16   Ht 5' (1.524 m)   Wt 52.6 kg   SpO2 99%   BMI 22.65 kg/m  Physical Exam Vitals and nursing note reviewed.  Constitutional:      General: She is not in acute distress.    Appearance: She is not toxic-appearing.  HENT:     Head: Normocephalic.  Cardiovascular:     Rate and Rhythm: Normal rate and regular rhythm.  Abdominal:     General: Abdomen is flat.     Palpations: Abdomen is soft.     Tenderness: There is abdominal tenderness in the right lower quadrant, suprapubic  area and left lower quadrant.  Skin:    General: Skin is warm and dry.  Neurological:     General: No focal deficit present.     Mental Status: She is alert.  Psychiatric:        Mood and Affect: Mood normal.        Behavior: Behavior normal.     ED Results / Procedures / Treatments   Labs (all labs ordered are listed, but only abnormal results are displayed) Labs Reviewed  COMPREHENSIVE METABOLIC PANEL - Abnormal; Notable for the following components:      Result Value   Calcium 8.6 (*)    Alkaline Phosphatase 37 (*)    Anion gap 4 (*)    All other components within normal limits  CBC - Abnormal; Notable for the following components:   RBC 3.83 (*)    All other components within normal limits  URINALYSIS, ROUTINE W REFLEX MICROSCOPIC - Abnormal; Notable for the following components:   APPearance HAZY (*)    Hgb urine dipstick MODERATE (*)    Bacteria, UA RARE (*)    All other components within normal limits  LIPASE, BLOOD  HCG, SERUM, QUALITATIVE    EKG None  Radiology US  Pelvis Complete Result Date: 08/10/2023 CLINICAL DATA:  Free fluid on CT EXAM: TRANSABDOMINAL  ULTRASOUND OF PELVIS TECHNIQUE: Transabdominal ultrasound examination of the pelvis was performed including evaluation of the uterus, ovaries, adnexal regions, and pelvic cul-de-sac. COMPARISON:  CT 08/10/2023 FINDINGS: Uterus Measurements: 6.5 x 2.7 x 4.6 cm = volume: 42.9 mL. No fibroids or other mass visualized. Endometrium Thickness: 5.9 mm.  No focal abnormality visualized. Right ovary Measurements: 3.6 x 2.1 x 2.3 cm = volume: 9.1 mL. Normal appearance/no adnexal mass. Left ovary Measurements: 3.7 x 2.4 x 3.3 cm = volume: 15.5 mL. Normal appearance/no adnexal mass. Other findings: Moderate volume slightly complex fluid within the pelvis and at the bilateral adnexa IMPRESSION: 1. Moderate volume slightly complex fluid within the pelvis and at the bilateral adnexa, possible hemorrhagic fluid. Findings are  nonspecific but could be seen with ruptured ovarian cyst, assuming negative HCG/pregnancy test. 2. Otherwise negative pelvic ultrasound. Electronically Signed   By: Luke Bun M.D.   On: 08/10/2023 22:05   CT ABDOMEN PELVIS W CONTRAST Result Date: 08/10/2023 CLINICAL DATA:  RLQ abdominal pain Pt reports severe lower abdominal pain pt states she experienced nausea and vomiting yesterday. Pain is located in lower abdomen, pelvic pain and flank pain.  EXAM: CT ABDOMEN AND PELVIS WITH CONTRAST TECHNIQUE: Multidetector CT imaging of the abdomen and pelvis was performed using the standard protocol following bolus administration of intravenous contrast. RADIATION DOSE REDUCTION: This exam was performed according to the departmental dose-optimization program which includes automated exposure control, adjustment of the mA and/or kV according to patient size and/or use of iterative reconstruction technique. CONTRAST:  OMNIPAQUE  IOHEXOL  300 MG/ML  SOLN COMPARISON:  CT abdomen pelvis 04/16/2023 FINDINGS: Lower chest: No acute abnormality. Hepatobiliary: No focal liver abnormality. No gallstones, gallbladder wall thickening, or pericholecystic fluid. No biliary dilatation. Pancreas: No focal lesion. Normal pancreatic contour. No surrounding inflammatory changes. No main pancreatic ductal dilatation. Spleen: Normal in size without focal abnormality. Adrenals/Urinary Tract: No adrenal nodule bilaterally. Bilateral kidneys enhance symmetrically. No hydronephrosis. No hydroureter. The urinary bladder is unremarkable. Stomach/Bowel: Stomach is within normal limits. No evidence of bowel wall thickening or dilatation. Appendix appears normal. Vascular/Lymphatic: No abdominal aorta or iliac aneurysm. No abdominal, pelvic, or inguinal lymphadenopathy. Reproductive: Uterus and bilateral adnexa are unremarkable. Left corpus luteum cyst-no further follow-up indicated. Other: Interval development of small to moderate volume  high density free fluid within the pelvis. No intraperitoneal free gas. No organized fluid collection. Musculoskeletal: No chest wall abnormality. No suspicious lytic or blastic osseous lesions. No acute displaced fracture. IMPRESSION: Interval development of small to moderate volume high density free fluid within the pelvis suggestive of blood product. Recommend pelvic ultrasound for further evaluation. Electronically Signed   By: Morgane  Naveau M.D.   On: 08/10/2023 20:39    Procedures Procedures    Medications Ordered in ED Medications  oxyCODONE -acetaminophen  (PERCOCET/ROXICET) 5-325 MG per tablet 1 tablet (1 tablet Oral Given 08/10/23 1704)  ondansetron  (ZOFRAN -ODT) disintegrating tablet 4 mg (4 mg Oral Given 08/10/23 1704)  iohexol  (OMNIPAQUE ) 300 MG/ML solution 100 mL (100 mLs Intravenous Contrast Given 08/10/23 1917)  ketorolac  (TORADOL ) 15 MG/ML injection 15 mg (15 mg Intravenous Given 08/10/23 2224)    ED Course/ Medical Decision Making/ A&P Clinical Course as of 08/10/23 2305  Mon Aug 10, 2023  2104 CT ABDOMEN PELVIS W CONTRAST IMPRESSION: Interval development of small to moderate volume high density free fluid within the pelvis suggestive of blood product. Recommend pelvic ultrasound for further evaluation.   [TY]  2214 US  Pelvis Complete IMPRESSION: 1. Moderate volume slightly complex fluid  within the pelvis and at the bilateral adnexa, possible hemorrhagic fluid. Findings are nonspecific but could be seen with ruptured ovarian cyst, assuming negative HCG/pregnancy test. 2. Otherwise negative pelvic ultrasound.   [TY]  2304 Patient's pain improved with Toradol  and Percocets.  Imaging without evidence of appendicitis.  Did have some free fluid which is consistent with ruptured ovarian cysts.  Hemoglobin is stable patient overall well-appearing.  Will have her follow-up with OB/GYN.  Discussed supportive care.  Return precautions given. [TY]    Clinical Course User  Index [TY] Neysa Caron PARAS, DO                                 Medical Decision Making This is a 23 year old female presenting emergency department for lower abdominal pain.  Afebrile nontachycardic hemodynamic stable.  History consistent with possible viral etiology, however on exam with more tenderness in lower abdomen family be expected.  Labs largely reassuring.  No leukocytosis to suggest systemic infection.  No metabolic derangement.  No kidney abnormalities.  No transaminitis to suggest hepatobiliary disease.  Lipase normal.  Pancreatitis unlikely.  Pregnancy test is negative.  Ectopic pregnancy unlikely.  UA negative for acute UTI.  CT scan with high density free fluid concerning for blood products; recommending follow-up ultrasound.  Ultrasound ordered.  Did receive Zofran  and Percocet for pain.  See ED course for further MDM/disposition.   Amount and/or Complexity of Data Reviewed External Data Reviewed:     Details: Per chart review patient has had several negative CT abdomens and 1 negative ultrasound of her pelvis Labs: ordered. Decision-making details documented in ED Course. Radiology: ordered. Decision-making details documented in ED Course.  Risk Prescription drug management. Decision regarding hospitalization. Diagnosis or treatment significantly limited by social determinants of health. Risk Details: Poor health literacy          Final Clinical Impression(s) / ED Diagnoses Final diagnoses:  Hemorrhagic cyst of ovary    Rx / DC Orders ED Discharge Orders          Ordered    oxyCODONE -acetaminophen  (PERCOCET/ROXICET) 5-325 MG tablet  Every 6 hours PRN        08/10/23 2256              Neysa Caron PARAS, DO 08/10/23 2305

## 2023-08-10 NOTE — ED Provider Notes (Signed)
 [facility]    CSN: [id number] Arrival date & time: 08/10/23  1520      History   Chief Complaint No chief complaint on file.   HPI Kathleen Miles is a 23 y.o. female.   The history is provided by the patient.   Patient presents for complaints of severe abdominal pain.  Patient states symptoms started over the past 24 hours.  She complains of pain to the right upper and lower quadrants, and left upper quadrants of her abdomen.  Patient states that she also experienced vomiting over the past 24 hours.  Patient denies fever, chills, chest pain, constipation, or urinary symptoms.  Patient states she has been seen in the emergency department for abdominal pain in the past, she was referred to GI for further evaluation.  Patient states she saw GI over the past week.  She states that she has also experienced some diarrhea.  Patient reports that she has not taken any medication for her symptoms.  History reviewed. No pertinent past medical history.  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 No pertinent surgical history.  OB History   No obstetric history on file.      Home Medications    Prior to Admission medications   Medication Sig Start Date End Date Taking? Authorizing Provider  norgestrel -ethinyl estradiol  (CRYSELLE -28) 0.3-30 MG-MCG tablet Take 1 tablet by mouth daily. 08/06/23   Duanne Butler DASEN, MD  XIFAXAN 550 MG TABS tablet Take 550 mg by mouth 3 (three) times daily. 05/04/23   [provider]    Family History Family History  Problem Relation Age of Onset   HIV Father     Social History Social History   Tobacco Use   Smoking status: Never   Smokeless tobacco: Never  Substance Use Topics   Alcohol use: No   Drug use: No     Allergies   Patient has no known  allergies.   Review of Systems Review of Systems Per HPI  Physical Exam Triage Vital Signs ED Triage Vitals  Encounter Vitals Group     BP 08/10/23 1528 124/77     Systolic BP Percentile --      Diastolic BP Percentile --      Pulse Rate 08/10/23 1528 88     Resp 08/10/23 1528 15     Temp 08/10/23 1528 98.6 F (37 C)     Temp src --      SpO2 08/10/23 1528 98 %     Weight --      Height --      Head Circumference --      Peak Flow --      Pain Score 08/10/23 1534 9     Pain Loc --      Pain Education --      Exclude from Growth Chart --    No data found.  Updated Vital Signs BP 124/77 (BP Location: Right Arm)   Pulse 88   Temp 98.6 F (37 C)   Resp 15   SpO2 98%   Visual Acuity Right Eye Distance:   Left Eye Distance:   Bilateral Distance:    Right Eye Near:   Left Eye Near:    Bilateral Near:     Physical Exam Vitals and nursing note reviewed.  Constitutional:  General: She is not in acute distress.    Appearance: Normal appearance.  HENT:     Head: Normocephalic.  Eyes:     Extraocular Movements: Extraocular movements intact.     Conjunctiva/sclera: Conjunctivae normal.     Pupils: Pupils are equal, round, and reactive to light.  Cardiovascular:     Rate and Rhythm: Normal rate and regular rhythm.     Pulses: Normal pulses.     Heart sounds: Normal heart sounds.  Pulmonary:     Effort: Pulmonary effort is normal.     Breath sounds: Normal breath sounds.  Abdominal:     General: Abdomen is flat. Bowel sounds are normal.     Palpations: Abdomen is soft.     Tenderness: There is generalized abdominal tenderness. There is guarding.  Musculoskeletal:     Cervical back: Normal range of motion.  Skin:    General: Skin is warm and dry.  Neurological:     General: No focal deficit present.     Mental Status: She is alert and oriented to person, place, and time.  Psychiatric:        Mood and Affect: Mood normal.        Behavior: Behavior  normal.      UC Treatments / Results  Labs (all labs ordered are listed, but only abnormal results are displayed) Labs Reviewed - No data to display  EKG   Radiology No results found.  Procedures Procedures (including critical care time)  Medications Ordered in UC Medications - No data to display  Initial Impression / Assessment and Plan / UC Course  I have reviewed the triage vital signs and the nursing notes.  Pertinent labs & imaging results that were available during my care of the patient were reviewed by me and considered in my medical decision making (see chart for details).  Patient presents for complaints of severe abdominal pain that started over the past 24 hours.  On exam, patient has moderate abdominal tenderness with guarding present.  She also reports that she has had nausea, vomiting, and diarrhea.  Patient's vital signs are stable at this time.  Given the severity of the patient's pain, and current presentation, advised patient that it is recommended that she go to the emergency department for a higher level of care to possibly include imaging and lab work.  Patient was in agreement with this plan of care and verbalized understanding.  All questions were answered.  Patient stable for discharge.  Vital signs were stable at discharge, patient able to travel via private vehicle.  Patient ambulatory at discharge. Final Clinical Impressions(s) / UC Diagnoses   Final diagnoses:  Abdominal pain, unspecified abdominal location  Nausea and vomiting, unspecified vomiting type     Discharge Instructions      Go to the ER for further evaluation.    ED Prescriptions   None    PDMP not reviewed this encounter.   Gilmer Etta PARAS, NP 08/10/23 1707

## 2023-08-10 NOTE — ED Triage Notes (Signed)
 Pt reports severe lower abdominal pain pt states she experienced nausea and vomiting yesterday. Pain is located in lower abdomen, pelvic pain and flank pain.

## 2023-08-10 NOTE — ED Triage Notes (Signed)
 Patient reports lower abdominal pain x 4 days ago. Patient reports nausea, vomiting, and diarrhea x 2 days. Denies fever, denies urinary symptoms.

## 2023-08-10 NOTE — Discharge Instructions (Addendum)
 As discussed take Tylenol  alternate with ibuprofen.  You may use Percocet for breakthrough pain.  Please follow-up with your OB/GYN.  Return immediately felt fevers, chills, severe abdominal pain, lightheadedness, dizziness, extreme fatigue or you develop any new or worsening symptoms that are concerning to you.

## 2023-08-11 ENCOUNTER — Other Ambulatory Visit: Payer: Self-pay | Admitting: Family Medicine

## 2023-08-11 ENCOUNTER — Encounter: Payer: Self-pay | Admitting: Family Medicine

## 2023-08-11 DIAGNOSIS — R102 Pelvic and perineal pain: Secondary | ICD-10-CM

## 2023-09-18 ENCOUNTER — Ambulatory Visit
Admission: EM | Admit: 2023-09-18 | Discharge: 2023-09-18 | Disposition: A | Discharge status: Home / Self Care | Payer: Medicaid Other | Attending: Family Medicine | Admitting: Family Medicine

## 2023-09-18 DIAGNOSIS — J029 Acute pharyngitis, unspecified: Secondary | ICD-10-CM | POA: Diagnosis present

## 2023-09-18 DIAGNOSIS — B9789 Other viral agents as the cause of diseases classified elsewhere: Secondary | ICD-10-CM | POA: Diagnosis not present

## 2023-09-18 LAB — POCT RAPID STREP A (OFFICE): Rapid Strep A Screen: NEGATIVE

## 2023-09-18 MED ORDER — LIDOCAINE VISCOUS HCL 2 % MT SOLN: 10.0000 mL | OROMUCOSAL | 0 refills | Status: AC | PRN

## 2023-09-18 MED ORDER — FLUTICASONE PROPIONATE 50 MCG/ACT NA SUSP: 1.0000 | Freq: Two times a day (BID) | NASAL | 2 refills | Status: AC

## 2023-09-18 NOTE — ED Triage Notes (Signed)
 Pt has had a sore throat x 4 days

## 2023-09-18 NOTE — ED Provider Notes (Signed)
 [facility]    CSN: [id number] Arrival date & time: 09/18/23  1409      History   Chief Complaint No chief complaint on file.   HPI Kathleen Miles is a 23 y.o. female.   Patient presenting today with 4-day history of sore throat, mild cough and congestion.  Denies fever, chills, chest pain, shortness of breath, abdominal pain, vomiting, diarrhea.  So far trying throat drops, warm drinks with minimal relief.  No known sick contacts recently.    History reviewed. No pertinent past medical history.  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 No pertinent surgical history.  OB History   No obstetric history on file.      Home Medications    Prior to Admission medications   Medication Sig Start Date End Date Taking? Authorizing Provider  fluticasone (FLONASE) 50 MCG/ACT nasal spray Place 1 spray into both nostrils 2 (two) times daily. 09/18/23  Yes Particia Nearing, PA-C  lidocaine (XYLOCAINE) 2 % solution Use as directed 10 mLs in the mouth or throat every 3 (three) hours as needed. 09/18/23  Yes Particia Nearing, PA-C  omeprazole (PRILOSEC) 20 MG capsule 1 cap(s) orally once a day for 30 days 09/03/23  Yes [provider]  norgestrel-ethinyl estradiol (CRYSELLE-28) 0.3-30 MG-MCG tablet Take 1 tablet by mouth daily. 08/06/23   Donita Brooks, MD  oxyCODONE-acetaminophen (PERCOCET/ROXICET) 5-325 MG tablet Take 1 tablet by mouth every 6 (six) hours as needed for severe pain (pain score 7-10). 08/10/23   Young, Harmon Dun, DO  XIFAXAN 550 MG TABS tablet Take 550 mg by mouth 3 (three) times daily. 05/04/23   [provider]    Family History Family History  Problem Relation Age of Onset   HIV Father     Social History Social History   Tobacco Use   Smoking status: Never    Smokeless tobacco: Never  Substance Use Topics   Alcohol use: No   Drug use: No     Allergies   Patient has no known allergies.   Review of Systems Review of Systems Per HPI  Physical Exam Triage Vital Signs ED Triage Vitals  Encounter Vitals Group     BP 09/18/23 1530 120/75     Systolic BP Percentile --      Diastolic BP Percentile --      Pulse Rate 09/18/23 1530 77     Resp 09/18/23 1530 18     Temp 09/18/23 1530 98.5 F (36.9 C)     Temp Source 09/18/23 1530 Oral     SpO2 09/18/23 1530 98 %     Weight --      Height --      Head Circumference --      Peak Flow --      Pain Score 09/18/23 1531 7     Pain Loc --      Pain Education --      Exclude from Growth Chart --    No data found.  Updated Vital Signs BP 120/75 (BP Location: Right Arm)   Pulse 77   Temp 98.5 F (36.9 C) (Oral)   Resp 18   LMP 09/07/2023 Comment: last day  SpO2 98%   Visual Acuity Right Eye Distance:   Left Eye Distance:   Bilateral Distance:    Right  Eye Near:   Left Eye Near:    Bilateral Near:     Physical Exam Vitals and nursing note reviewed.  Constitutional:      Appearance: Normal appearance.  HENT:     Head: Atraumatic.     Right Ear: Tympanic membrane and external ear normal.     Left Ear: Tympanic membrane and external ear normal.     Nose: Nose normal.     Mouth/Throat:     Mouth: Mucous membranes are moist.     Pharynx: Posterior oropharyngeal erythema present. No oropharyngeal exudate.  Eyes:     Extraocular Movements: Extraocular movements intact.     Conjunctiva/sclera: Conjunctivae normal.  Cardiovascular:     Rate and Rhythm: Normal rate and regular rhythm.     Heart sounds: Normal heart sounds.  Pulmonary:     Effort: Pulmonary effort is normal.     Breath sounds: Normal breath sounds. No wheezing or rales.  Musculoskeletal:        General: Normal range of motion.     Cervical back: Normal range of motion and neck supple.  Lymphadenopathy:      Cervical: No cervical adenopathy.  Skin:    General: Skin is warm and dry.  Neurological:     Mental Status: She is alert and oriented to person, place, and time.  Psychiatric:        Mood and Affect: Mood normal.        Thought Content: Thought content normal.      UC Treatments / Results  Labs (all labs ordered are listed, but only abnormal results are displayed) Labs Reviewed  CULTURE, GROUP A STREP [facility])  POCT RAPID STREP A (OFFICE)    EKG   Radiology No results found.  Procedures Procedures (including critical care time)  Medications Ordered in UC Medications - No data to display  Initial Impression / Assessment and Plan / UC Course  I have reviewed the triage vital signs and the nursing notes.  Pertinent labs & imaging results that were available during my care of the patient were reviewed by me and considered in my medical decision making (see chart for details).     Vitals and exam reassuring today, rapid strep negative, throat culture pending.  Suspect viral etiology.  Treat with supportive over-the-counter medications, home care, viscous lidocaine, Flonase.  Return for worsening symptoms.  Final Clinical Impressions(s) / UC Diagnoses   Final diagnoses:  Sore throat  Viral pharyngitis   Discharge Instructions   None    ED Prescriptions     Medication Sig Dispense Auth. Provider   lidocaine (XYLOCAINE) 2 % solution Use as directed 10 mLs in the mouth or throat every 3 (three) hours as needed. 100 mL Particia Nearing, PA-C   fluticasone [facility]) 50 MCG/ACT nasal spray Place 1 spray into both nostrils 2 (two) times daily. 16 g Particia Nearing, [place]      PDMP not reviewed this encounter.   Particia Nearing, [place] 09/18/23 [phone]

## 2023-09-21 LAB — CULTURE, GROUP A STREP (THRC)

## 2023-09-23 ENCOUNTER — Ambulatory Visit (INDEPENDENT_AMBULATORY_CARE_PROVIDER_SITE_OTHER): Payer: Medicaid Other | Admitting: Obstetrics and Gynecology

## 2023-09-23 ENCOUNTER — Encounter: Payer: Self-pay | Admitting: Obstetrics and Gynecology

## 2023-09-23 ENCOUNTER — Other Ambulatory Visit: Payer: Self-pay

## 2023-09-23 VITALS — BP 118/77 | HR 79 | Wt 115.2 lb

## 2023-09-23 DIAGNOSIS — Z1331 Encounter for screening for depression: Secondary | ICD-10-CM | POA: Diagnosis not present

## 2023-09-23 DIAGNOSIS — R102 Pelvic and perineal pain unspecified side: Secondary | ICD-10-CM

## 2023-09-23 DIAGNOSIS — N941 Unspecified dyspareunia: Secondary | ICD-10-CM | POA: Diagnosis not present

## 2023-09-23 NOTE — Progress Notes (Signed)
 NEW GYNECOLOGY PATIENT Patient name: Kathleen Miles MRN [id number]  Date of birth: 08/13/2000 Chief Complaint:   No chief complaint on file.     History:  Adri Schloss is a 23 y.o. G0P0000 being seen today for right sided pelvic pain.  Currently on OCPs; feels that she has had continued pain since ruptured cyst in January.     Discussed the use of AI scribe software for clinical note transcription with the patient, who gave verbal consent to proceed.  History of Present Illness   Kathleen Miles is a 23 year old female who presents with right-sided abdominal pain following a ruptured ovarian cyst.  She experiences persistent right-sided abdominal pain following a ruptured ovarian cyst. Initially, the pain was excruciating and constant, confirmed by a CT scan and ultrasound to be due to a ruptured ovarian cyst with significant blood in the abdomen. The pain has since become intermittent, always localized to the right side, and has decreased in severity, though it still affects her ability to perform physical activities such as lifting boxes at work.  She stopped taking birth control for a month or two around December or January, which she associates with the onset of her symptoms. She switched to a different brand of birth control due to nausea and sickness with the previous one. She restarted birth control in January to prevent pregnancy as she was sexually active at the time.  She experienced painful intercourse prior to the rupture, which was unusual for her. Following the rupture, she abstained from intercourse to allow her body to heal. She is no longer with the same partner and is unsure if the pain persists during intercourse. No current pain during intercourse as she is not sexually active with a partner.  Her menstrual cycle was affected, with a delayed period following the rupture, initially causing concern for pregnancy. When her period resumed, it lasted longer  than usual, extending beyond a week. She reports tenderness in the abdomen but not significant pain. No recent masturbation.           Gynecologic History Patient's last menstrual period was 09/02/2023. Contraception: abstinence and OCP (estrogen/progesterone) Last Pap: No results found for: "DIAGPAP", "HPVHIGH", "ADEQPAP"  High Risk HPV: Positive  Adequacy:  Satisfactory for evaluation, transformation zone component PRESENT  Diagnosis:  Atypical squamous cells of undetermined significance (ASC-US)  Last Mammogram: n/a Last Colonoscopy: n/a  Obstetric History OB History  Gravida Para Term Preterm AB Living  0 0 0 0 0 0  SAB IAB Ectopic Multiple Live Births  0 0 0 0 0    No past medical history on file.  No past surgical history on file.  Current Outpatient Medications on File Prior to Visit  Medication Sig Dispense Refill   norgestrel-ethinyl estradiol (CRYSELLE-28) 0.3-30 MG-MCG tablet Take 1 tablet by mouth daily. 28 tablet 11   fluticasone (FLONASE) 50 MCG/ACT nasal spray Place 1 spray into both nostrils 2 (two) times daily. (Patient not taking: Reported on 09/23/2023) 16 g 2   lidocaine (XYLOCAINE) 2 % solution Use as directed 10 mLs in the mouth or throat every 3 (three) hours as needed. (Patient not taking: Reported on 09/23/2023) 100 mL 0   omeprazole (PRILOSEC) 20 MG capsule 1 cap(s) orally once a day for 30 days (Patient not taking: Reported on 09/23/2023)     oxyCODONE-acetaminophen (PERCOCET/ROXICET) 5-325 MG tablet Take 1 tablet by mouth every 6 (six) hours as needed for severe pain (pain score 7-10). (Patient not taking: Reported  on 09/23/2023) 6 tablet 0   XIFAXAN 550 MG TABS tablet Take 550 mg by mouth 3 (three) times daily. (Patient not taking: Reported on 09/23/2023)     No current facility-administered medications on file prior to visit.    No Known Allergies  Social History:  reports that she has never smoked. She has never used smokeless tobacco. She  reports that she does not drink alcohol and does not use drugs.  Family History  Problem Relation Age of Onset   HIV Father     The following portions of the patient's history were reviewed and updated as appropriate: allergies, current medications, past family history, past medical history, past social history, past surgical history and problem list.  Review of Systems Pertinent items noted in HPI and remainder of comprehensive ROS otherwise negative.  Physical Exam:  BP 118/77   Pulse 79   Wt 115 lb 3.2 oz (52.3 kg)   LMP 09/02/2023 Comment: last day  BMI 22.50 kg/m  Physical Exam Vitals and nursing note reviewed. Exam conducted with a chaperone present.  Constitutional:      Appearance: Normal appearance.  Pulmonary:     Effort: Pulmonary effort is normal.  Abdominal:     Palpations: Abdomen is soft.  Genitourinary:    General: Normal vulva.     Exam position: Lithotomy position.     Comments: Normal appearing vulva Normal vulvar sensation bilaterally Nontender superficial pelvic floor muscles Nontender ischial tuberosities bilaterally  Allodynia at introitus: No  Anal wink present Right levator ani 2/10 Right ischiococcygeous 1/10 Right obturator internus 1/10 Left levator ani 3/10 Left ischioccocygeous 7/10 Left obturator internus tender    Neurological:     Mental Status: She is alert.        Assessment and Plan:   Assessment and Plan    Ruptured Ovarian Cyst Right-sided abdominal pain, previously diagnosed with a ruptured ovarian cyst. Pain has improved but is still intermittent. No current sexual activity. - interval pelvic ultrasound ordered to reases pelvic structures  Dyspareunia History of painful intercourse prior to the rupture of the ovarian cyst. -pelvic myalgia noted on exam, PFPT referral placed  Oral Contraceptive Use Stopped and restarted oral contraceptives due to nausea with previous brand. Currently well-tolerated. -Continue  current oral contraceptive regimen.  Menstrual Irregularities History of irregular periods, recently had a prolonged period following the rupture of the ovarian cyst. -Continue to monitor menstrual cycle while on oral contraceptives.      Routine preventative health maintenance measures emphasized. Please refer to After Visit Summary for other counseling recommendations.   Follow-up: No follow-ups on file.      Lorriane Shire, MD Obstetrician & Gynecologist, [facility] [employer], [facility]

## 2023-10-01 ENCOUNTER — Ambulatory Visit (HOSPITAL_COMMUNITY)
Admission: RE | Admit: 2023-10-01 | Discharge: 2023-10-01 | Disposition: A | Discharge status: Home / Self Care | Payer: Medicaid Other | Source: Ambulatory Visit | Attending: Obstetrics and Gynecology | Admitting: Obstetrics and Gynecology

## 2023-10-01 DIAGNOSIS — N941 Unspecified dyspareunia: Secondary | ICD-10-CM | POA: Insufficient documentation

## 2023-10-02 ENCOUNTER — Encounter (HOSPITAL_COMMUNITY): Payer: Self-pay

## 2023-10-02 ENCOUNTER — Emergency Department (HOSPITAL_COMMUNITY)
Admission: EM | Admit: 2023-10-02 | Discharge: 2023-10-03 | Disposition: A | Discharge status: Home / Self Care | Attending: Emergency Medicine | Admitting: Emergency Medicine

## 2023-10-02 ENCOUNTER — Other Ambulatory Visit: Payer: Self-pay

## 2023-10-02 ENCOUNTER — Emergency Department (HOSPITAL_COMMUNITY)

## 2023-10-02 DIAGNOSIS — R1031 Right lower quadrant pain: Secondary | ICD-10-CM | POA: Insufficient documentation

## 2023-10-02 DIAGNOSIS — S161XXA Strain of muscle, fascia and tendon at neck level, initial encounter: Secondary | ICD-10-CM | POA: Insufficient documentation

## 2023-10-02 DIAGNOSIS — M542 Cervicalgia: Secondary | ICD-10-CM | POA: Diagnosis present

## 2023-10-02 DIAGNOSIS — Y92410 Unspecified street and highway as the place of occurrence of the external cause: Secondary | ICD-10-CM | POA: Diagnosis not present

## 2023-10-02 DIAGNOSIS — R519 Headache, unspecified: Secondary | ICD-10-CM | POA: Diagnosis not present

## 2023-10-02 DIAGNOSIS — R109 Unspecified abdominal pain: Secondary | ICD-10-CM

## 2023-10-02 MED ORDER — OXYCODONE-ACETAMINOPHEN 5-325 MG PO TABS
1.0000 | ORAL_TABLET | ORAL | Status: DC | PRN
  Administered 2023-10-02: 1 via ORAL
  Filled 2023-10-02: qty 1

## 2023-10-02 MED ORDER — SODIUM CHLORIDE 0.9 % IV BOLUS
1000.0000 mL | Freq: Once | INTRAVENOUS | Status: AC
  Administered 2023-10-02: 1000 mL via INTRAVENOUS
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
## 2023-10-03 ENCOUNTER — Emergency Department (HOSPITAL_COMMUNITY)

## 2023-10-03 LAB — BASIC METABOLIC PANEL
Anion gap: 7 (ref 5–15)
BUN: 10 mg/dL (ref 6–20)
CO2: 23 mmol/L (ref 22–32)
Calcium: 8.6 mg/dL — ABNORMAL LOW (ref 8.9–10.3)
Chloride: 105 mmol/L (ref 98–111)
Creatinine, Ser: 0.53 mg/dL (ref 0.44–1.00)
GFR, Estimated: >60 mL/min (ref >60)
Glucose, Bld: 109 mg/dL — ABNORMAL HIGH (ref 70–99)
Potassium: 3.4 mmol/L — ABNORMAL LOW (ref 3.5–5.1)
Sodium: 135 mmol/L (ref 135–145)

## 2023-10-03 LAB — TYPE AND SCREEN
ABO/RH(D): A POS
Antibody Screen: NEGATIVE

## 2023-10-03 LAB — HCG, SERUM, QUALITATIVE: Preg, Serum: NEGATIVE

## 2023-10-03 LAB — CBC WITH DIFFERENTIAL/PLATELET
Abs Immature Granulocytes: 0.02 10*3/uL (ref 0.00–0.07)
Basophils Absolute: 0 10*3/uL (ref 0.0–0.1)
Basophils Relative: 1 %
Eosinophils Absolute: 0.1 10*3/uL (ref 0.0–0.5)
Eosinophils Relative: 1 %
HCT: 41.9 % (ref 36.0–46.0)
Hemoglobin: 14 g/dL (ref 12.0–15.0)
Immature Granulocytes: 0 %
Lymphocytes Relative: 21 %
Lymphs Abs: 1.7 10*3/uL (ref 0.7–4.0)
MCH: 32.6 pg (ref 26.0–34.0)
MCHC: 33.4 g/dL (ref 30.0–36.0)
MCV: 97.7 fL (ref 80.0–100.0)
Monocytes Absolute: 0.4 10*3/uL (ref 0.1–1.0)
Monocytes Relative: 5 %
Neutro Abs: 5.9 10*3/uL (ref 1.7–7.7)
Neutrophils Relative %: 72 %
Platelets: 231 10*3/uL (ref 150–400)
RBC: 4.29 MIL/uL (ref 3.87–5.11)
RDW: 12.3 % (ref 11.5–15.5)
WBC: 8.1 10*3/uL (ref 4.0–10.5)
nRBC: 0 % (ref 0.0–0.2)

## 2023-10-03 MED ORDER — IOHEXOL 300 MG/ML  SOLN
100.0000 mL | Freq: Once | INTRAMUSCULAR | Status: AC | PRN
  Administered 2023-10-03: 100 mL via INTRAVENOUS
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
## 2023-10-05 ENCOUNTER — Ambulatory Visit
Admission: EM | Admit: 2023-10-05 | Discharge: 2023-10-05 | Disposition: A | Discharge status: Home / Self Care | Attending: Nurse Practitioner | Admitting: Nurse Practitioner

## 2023-10-05 ENCOUNTER — Encounter: Payer: Self-pay | Admitting: Obstetrics and Gynecology

## 2023-10-05 DIAGNOSIS — R519 Headache, unspecified: Secondary | ICD-10-CM | POA: Diagnosis not present

## 2023-10-05 DIAGNOSIS — S134XXA Sprain of ligaments of cervical spine, initial encounter: Secondary | ICD-10-CM | POA: Diagnosis not present

## 2023-10-05 MED ORDER — KETOROLAC TROMETHAMINE 30 MG/ML IJ SOLN
30.0000 mg | Freq: Once | INTRAMUSCULAR | Status: AC
  Administered 2023-10-05: 30 mg via INTRAMUSCULAR

## 2023-10-05 MED ORDER — CYCLOBENZAPRINE HCL 5 MG PO TABS: 5.0000 mg | ORAL_TABLET | Freq: Three times a day (TID) | ORAL | 0 refills | Status: DC | PRN

## 2023-10-05 MED ORDER — DEXAMETHASONE SODIUM PHOSPHATE 10 MG/ML IJ SOLN
10.0000 mg | INTRAMUSCULAR | Status: AC
  Administered 2023-10-05: 10 mg via INTRAMUSCULAR
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
## 2023-10-05 NOTE — ED Provider Notes (Signed)
 [facility]    CSN: [id number] Arrival date & time: 10/05/23  1657      History   Chief Complaint Chief Complaint  Patient presents with  . Headache    HPI Kathleen Miles is a 23 y.o. female.    Headache   History reviewed. No pertinent past medical history.  Patient Active Problem List   Diagnosis Date Noted  . Encounter for contraceptive management 02/08/2020  . Abnormal uterine bleeding 04/23/2017  . 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 No pertinent surgical history.  OB History     Gravida  0   Para  0   Term  0   Preterm  0   AB  0   Living  0      SAB  0   IAB  0   Ectopic  0   Multiple  0   Live Births  0            Home Medications    Prior to Admission medications   Medication Sig Start Date End Date Taking? Authorizing Provider  norgestrel-ethinyl estradiol (CRYSELLE-28) 0.3-30 MG-MCG tablet Take 1 tablet by mouth daily. 08/06/23  Yes Donita Brooks, MD  fluticasone (FLONASE) 50 MCG/ACT nasal spray Place 1 spray into both nostrils 2 (two) times daily. Patient not taking: Reported on 09/23/2023 09/18/23   Particia Nearing, PA-C  lidocaine (XYLOCAINE) 2 % solution Use as directed 10 mLs in the mouth or throat every 3 (three) hours as needed. Patient not taking: Reported on 09/23/2023 09/18/23   Particia Nearing, PA-C  omeprazole (PRILOSEC) 20 MG capsule 1 cap(s) orally once a day for 30 days Patient not taking: Reported on 09/23/2023 09/03/23   [provider]  oxyCODONE-acetaminophen (PERCOCET/ROXICET) 5-325 MG tablet Take 1 tablet by mouth every 6 (six) hours as needed for severe pain (pain score 7-10). Patient not taking: Reported on 09/23/2023 08/10/23   Coral Spikes, DO  XIFAXAN 550 MG TABS tablet Take 550 mg by mouth 3 (three) times daily. Patient not taking: Reported on 09/23/2023 05/04/23   [provider]    Family History Family History  Problem Relation  Age of Onset  . HIV Father     Social History Social History   Tobacco Use  . Smoking status: Never  . Smokeless tobacco: Never  Vaping Use  . Vaping status: Never Used  Substance Use Topics  . Alcohol use: No  . Drug use: No     Allergies   Patient has no known allergies.   Review of Systems Review of Systems  Neurological:  Positive for headaches.     Physical Exam Triage Vital Signs ED Triage Vitals  Encounter Vitals Group     BP 10/05/23 2005 122/74     Systolic BP Percentile --      Diastolic BP Percentile --      Pulse Rate 10/05/23 2005 64     Resp 10/05/23 2005 16     Temp 10/05/23 2005 98.8 F (37.1 C)     Temp Source 10/05/23 2005 Oral     SpO2 10/05/23 2005 97 %     Weight --      Height --      Head Circumference --      Peak Flow --      Pain Score 10/05/23 2006 7     Pain Loc --      Pain Education --  Exclude from Growth Chart --    No data found.  Updated Vital Signs BP 122/74 (BP Location: Right Arm)   Pulse 64   Temp 98.8 F (37.1 C) (Oral)   Resp 16   LMP 10/05/2023 (Exact Date)   SpO2 97%   Visual Acuity Right Eye Distance:   Left Eye Distance:   Bilateral Distance:    Right Eye Near:   Left Eye Near:    Bilateral Near:     Physical Exam Vitals and nursing note reviewed.  Constitutional:      General: She is not in acute distress.    Appearance: Normal appearance.  HENT:     Head: Normocephalic.     Right Ear: Tympanic membrane, ear canal and external ear normal.     Left Ear: Tympanic membrane, ear canal and external ear normal.     Nose: Nose normal.     Mouth/Throat:     Mouth: Mucous membranes are moist.  Eyes:     Extraocular Movements: Extraocular movements intact.     Conjunctiva/sclera: Conjunctivae normal.     Pupils: Pupils are equal, round, and reactive to light.  Cardiovascular:     Rate and Rhythm: Normal rate and regular rhythm.     Pulses: Normal pulses.     Heart sounds: Normal heart  sounds.  Pulmonary:     Effort: Pulmonary effort is normal.     Breath sounds: Normal breath sounds.  Abdominal:     General: Bowel sounds are normal.     Palpations: Abdomen is soft.     Tenderness: There is no abdominal tenderness.  Musculoskeletal:     Cervical back: Pain with movement and muscular tenderness (left neck) present. Decreased range of motion.  Lymphadenopathy:     Cervical: No cervical adenopathy.  Skin:    General: Skin is warm and dry.  Neurological:     General: No focal deficit present.     Mental Status: She is alert and oriented to person, place, and time.  Psychiatric:        Mood and Affect: Mood normal.        Behavior: Behavior normal.     UC Treatments / Results  Labs (all labs ordered are listed, but only abnormal results are displayed) Labs Reviewed - No data to display  EKG   Radiology No results found.  Procedures Procedures (including critical care time)  Medications Ordered in UC Medications - No data to display  Initial Impression / Assessment and Plan / UC Course  I have reviewed the triage vital signs and the nursing notes.  Pertinent labs & imaging results that were available during my care of the patient were reviewed by me and considered in my medical decision making (see chart for details).     *** Final Clinical Impressions(s) / UC Diagnoses   Final diagnoses:  None   Discharge Instructions   None    ED Prescriptions   None    PDMP not reviewed this encounter.

## 2023-10-05 NOTE — Discharge Instructions (Addendum)
 You were given injections of Toradol 30 mg and Decadron 10 mg.  Do not take any additional NSAIDs such as ibuprofen, Aleve, Advil, or naproxen.  Take Tylenol for the remainder of the day for any breakthrough pain or discomfort. Take medication as prescribed.  As discussed, the muscle relaxer may cause drowsiness.  No driving, operating heavy equipment, or drinking alcohol while taking the medication. Continue the use of ice or heat.  Apply ice for pain or swelling, heat for spasm or stiffness.  Apply for 20 minutes, remove for 1 hour, repeat as needed. Gentle range of motion exercises of your head and neck to help decrease your recovery time. Follow-up with your PCP as scheduled. If you develop the inability to move your head or neck, trouble swallowing, worsening pain, or other concerns, please follow-up in the emergency department immediately for further evaluation. Follow-up as needed.

## 2023-10-13 ENCOUNTER — Telehealth: Payer: Self-pay | Admitting: Family Medicine

## 2023-10-13 ENCOUNTER — Ambulatory Visit: Admitting: Family Medicine

## 2023-10-13 ENCOUNTER — Encounter: Payer: Self-pay | Admitting: Family Medicine

## 2023-10-13 VITALS — BP 122/82 | HR 71 | Ht 60.0 in | Wt 115.0 lb

## 2023-10-13 DIAGNOSIS — K219 Gastro-esophageal reflux disease without esophagitis: Secondary | ICD-10-CM | POA: Insufficient documentation

## 2023-10-13 DIAGNOSIS — S060XAD Concussion with loss of consciousness status unknown, subsequent encounter: Secondary | ICD-10-CM

## 2023-10-13 DIAGNOSIS — S134XXD Sprain of ligaments of cervical spine, subsequent encounter: Secondary | ICD-10-CM

## 2023-10-13 DIAGNOSIS — Q899 Congenital malformation, unspecified: Secondary | ICD-10-CM | POA: Diagnosis not present

## 2023-10-13 MED ORDER — SULFAMETHOXAZOLE-TRIMETHOPRIM 800-160 MG PO TABS: 1.0000 | ORAL_TABLET | Freq: Two times a day (BID) | ORAL | 0 refills | Status: DC

## 2023-10-13 MED ORDER — DICLOFENAC SODIUM 75 MG PO TBEC: 75.0000 mg | DELAYED_RELEASE_TABLET | Freq: Two times a day (BID) | ORAL | 0 refills | Status: DC
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
## 2023-10-13 NOTE — Addendum Note (Signed)
 Addended by: Venia Carbon K on: 10/13/2023 04:36 PM   Modules accepted: Orders

## 2023-10-16 LAB — WOUND CULTURE
MICRO NUMBER:: 16215436
SPECIMEN QUALITY:: ADEQUATE

## 2023-11-07 ENCOUNTER — Ambulatory Visit
Admission: EM | Admit: 2023-11-07 | Discharge: 2023-11-07 | Disposition: A | Discharge status: Home / Self Care | Attending: Internal Medicine | Admitting: Internal Medicine

## 2023-11-07 DIAGNOSIS — J069 Acute upper respiratory infection, unspecified: Secondary | ICD-10-CM

## 2023-11-07 LAB — POCT RAPID STREP A (OFFICE): Rapid Strep A Screen: NEGATIVE

## 2023-11-07 MED ORDER — PROMETHAZINE-DM 6.25-15 MG/5ML PO SYRP: 5.0000 mL | ORAL_SOLUTION | Freq: Every evening | ORAL | 0 refills | Status: DC | PRN

## 2023-11-07 MED ORDER — GUAIFENESIN ER 1200 MG PO TB12: 1200.0000 mg | ORAL_TABLET | Freq: Two times a day (BID) | ORAL | 0 refills | Status: DC

## 2023-11-07 NOTE — ED Provider Notes (Signed)
 [facility]    CSN: [id number] Arrival date & time: 11/07/23  4098      History   Chief Complaint No chief complaint on file.   HPI Kathleen Miles is a 23 y.o. female.   Patient presents to urgent care for evaluation of nasal congestion, sore throat, cough, and generalized fatigue that started approximately 6 days ago.  Cough is somewhat productive, however mostly dry at nighttime.  Cough is waking her up at nighttime and interrupting sleep.  Sore throat is worsened by swallowing and coughing.  Denies N/V/D, abdominal pain, rash, SOB, CP, and palpitations.  No recent fevers or chills to her knowledge.  Denies history of chronic respiratory problems.  Taking over-the-counter medications for allergy symptoms without relief.     History reviewed. No pertinent past medical history.  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 No pertinent surgical history.  OB History     Gravida  0   Para  0   Term  0   Preterm  0   AB  0   Living  0      SAB  0   IAB  0   Ectopic  0   Multiple  0   Live Births  0            Home Medications    Prior to Admission medications   Medication Sig Start Date End Date Taking? Authorizing Provider  Guaifenesin 1200 MG TB12 Take 1 tablet (1,200 mg total) by mouth in the morning and at bedtime. 11/07/23  Yes Starlene Eaton, FNP  promethazine-dextromethorphan (PROMETHAZINE-DM) 6.25-15 MG/5ML syrup Take 5 mLs by mouth at bedtime as needed for cough. 11/07/23  Yes Starlene Eaton, FNP  cyclobenzaprine (FLEXERIL) 5 MG tablet Take 1 tablet (5 mg total) by mouth 3 (three) times daily as needed  for muscle spasms. 10/05/23   Leath-Warren, Belen Bowers, NP  diclofenac (VOLTAREN) 75 MG EC tablet Take 1 tablet (75 mg total) by mouth 2 (two) times daily. 10/13/23   Austine Lefort, MD  fluticasone (FLONASE) 50 MCG/ACT nasal spray Place 1 spray into both nostrils 2 (two) times daily. 09/18/23   Corbin Dess, PA-C  lidocaine (XYLOCAINE) 2 % solution Use as directed 10 mLs in the mouth or throat every 3 (three) hours as needed. 09/18/23   Corbin Dess, PA-C  norgestrel-ethinyl estradiol (CRYSELLE-28) 0.3-30 MG-MCG tablet Take 1 tablet by mouth daily. 08/06/23   Austine Lefort, MD  omeprazole (PRILOSEC) 20 MG capsule 1 cap(s) orally once a day for 30 days 09/03/23   [provider]  oxyCODONE-acetaminophen (PERCOCET/ROXICET) 5-325 MG tablet Take 1 tablet by mouth every 6 (six) hours as needed for severe pain (pain score 7-10). 08/10/23   Rolinda Climes, DO  sulfamethoxazole-trimethoprim (BACTRIM DS) 800-160 MG tablet Take 1 tablet by mouth 2 (two) times daily. 10/13/23   Austine Lefort, MD  XIFAXAN 550 MG TABS tablet Take 550 mg by mouth 3 (three) times daily. 05/04/23   [provider]    Family History Family History  Problem Relation Age of Onset   HIV Father     Social  History Social History   Tobacco Use   Smoking status: Never   Smokeless tobacco: Never  Vaping Use   Vaping status: Never Used  Substance Use Topics   Alcohol use: No   Drug use: No     Allergies   Patient has no known allergies.   Review of Systems Review of Systems Per HPI  Physical Exam Triage Vital Signs ED Triage Vitals  Encounter Vitals Group     BP 11/07/23 1031 128/72     Systolic BP Percentile --      Diastolic BP Percentile --      Pulse Rate 11/07/23 1031 71     Resp 11/07/23 1031 20     Temp 11/07/23 1031 98.6 F (37 C)     Temp Source 11/07/23 1031 Oral     SpO2 11/07/23 1031 98 %     Weight --      Height --      Head Circumference --      Peak  Flow --      Pain Score 11/07/23 1033 6     Pain Loc --      Pain Education --      Exclude from Growth Chart --    No data found.  Updated Vital Signs BP 128/72 (BP Location: Right Arm)   Pulse 71   Temp 98.6 F (37 C) (Oral)   Resp 20   LMP 10/07/2023 (Approximate)   SpO2 98%   Visual Acuity Right Eye Distance:   Left Eye Distance:   Bilateral Distance:    Right Eye Near:   Left Eye Near:    Bilateral Near:     Physical Exam Vitals and nursing note reviewed.  Constitutional:      Appearance: She is not ill-appearing or toxic-appearing.  HENT:     Head: Normocephalic and atraumatic.     Right Ear: Hearing, tympanic membrane, ear canal and external ear normal.     Left Ear: Hearing, tympanic membrane, ear canal and external ear normal.     Nose: Congestion present.     Mouth/Throat:     Lips: Pink.     Mouth: Mucous membranes are moist. No injury or oral lesions.     Dentition: Normal dentition.     Tongue: No lesions.     Pharynx: Oropharynx is clear. Uvula midline. Posterior oropharyngeal erythema present. No pharyngeal swelling, oropharyngeal exudate, uvula swelling or postnasal drip.     Tonsils: No tonsillar exudate.     Comments: Mild erythema to posterior oropharynx with small amount of clear postnasal drainage visualized. No trismus, phonation normal, maintaining secretions without difficulty.  Eyes:     General: Lids are normal. Vision grossly intact. Gaze aligned appropriately.     Extraocular Movements: Extraocular movements intact.     Conjunctiva/sclera: Conjunctivae normal.  Neck:     Trachea: Trachea and phonation normal.  Cardiovascular:     Rate and Rhythm: Normal rate and regular rhythm.     Heart sounds: Normal heart sounds, S1 normal and S2 normal.  Pulmonary:     Effort: Pulmonary effort is normal. No respiratory distress.     Breath sounds: Normal breath sounds and air entry.  Musculoskeletal:     Cervical back: Neck supple.   Lymphadenopathy:     Cervical: No cervical adenopathy.  Skin:    General: Skin is warm and dry.     Capillary Refill: Capillary refill takes less than 2 seconds.  Findings: No rash.  Neurological:     General: No focal deficit present.     Mental Status: She is alert and oriented to person, place, and time. Mental status is at baseline.     Cranial Nerves: No dysarthria or facial asymmetry.  Psychiatric:        Mood and Affect: Mood normal.        Speech: Speech normal.        Behavior: Behavior normal.        Thought Content: Thought content normal.        Judgment: Judgment normal.      UC Treatments / Results  Labs (all labs ordered are listed, but only abnormal results are displayed) Labs Reviewed  POCT RAPID STREP A (OFFICE)    EKG   Radiology No results found.  Procedures Procedures (including critical care time)  Medications Ordered in UC Medications - No data to display  Initial Impression / Assessment and Plan / UC Course  I have reviewed the triage vital signs and the nursing notes.  Pertinent labs & imaging results that were available during my care of the patient were reviewed by me and considered in my medical decision making (see chart for details).   1.  Viral URI with cough Suspect viral URI, viral syndrome.  Strep/viral testing: Group A strep testing is negative, throat culture pending.  Physical exam findings reassuring, vital signs hemodynamically stable, and lungs clear, therefore deferred imaging of the chest.  Advised supportive care/prescriptions for symptomatic relief as outlined in AVS.    Counseled patient on potential for adverse effects with medications prescribed/recommended today, strict ER and return-to-clinic precautions discussed, patient verbalized understanding.    Final Clinical Impressions(s) / UC Diagnoses   Final diagnoses:  Viral URI with cough     Discharge Instructions      You have a viral illness which  will improve on its own with rest, fluids, and medications to help with your symptoms.  Tylenol, guaifenesin (plain mucinex), and saline nasal sprays may help relieve symptoms.  Promethazine DM at bedtime as needed- causes drowsiness.   Two teaspoons of honey in 1 cup of warm water every 4-6 hours may help with throat pains.  Humidifier in room at nighttime may help soothe cough (clean well daily).   For chest pain, shortness of breath, inability to keep food or fluids down without vomiting, fever that does not respond to tylenol or motrin, or any other severe symptoms, please go to the ER for further evaluation. Return to urgent care as needed, otherwise follow-up with PCP.     ED Prescriptions     Medication Sig Dispense Auth. Provider   Guaifenesin 1200 MG TB12 Take 1 tablet (1,200 mg total) by mouth in the morning and at bedtime. 14 tablet Reni Hausner M, FNP   promethazine-dextromethorphan (PROMETHAZINE-DM) 6.25-15 MG/5ML syrup Take 5 mLs by mouth at bedtime as needed for cough. 118 mL Starlene Eaton, FNP      PDMP not reviewed this encounter.   Starlene Eaton, [place] 11/07/23 1118

## 2023-11-07 NOTE — ED Triage Notes (Signed)
 Pt reports she has some throat pain, cannot breath at night, and hurt to swallow x 6 days    Took allergy meds

## 2023-11-07 NOTE — Discharge Instructions (Addendum)
 You have a viral illness which will improve on its own with rest, fluids, and medications to help with your symptoms.  Tylenol, guaifenesin (plain mucinex), and saline nasal sprays may help relieve symptoms.  Promethazine DM at bedtime as needed- causes drowsiness.   Two teaspoons of honey in 1 cup of warm water every 4-6 hours may help with throat pains.  Humidifier in room at nighttime may help soothe cough (clean well daily).   For chest pain, shortness of breath, inability to keep food or fluids down without vomiting, fever that does not respond to tylenol or motrin, or any other severe symptoms, please go to the ER for further evaluation. Return to urgent care as needed, otherwise follow-up with PCP.

## 2023-11-10 LAB — CULTURE, GROUP A STREP (THRC)

## 2023-11-12 ENCOUNTER — Other Ambulatory Visit: Payer: Self-pay

## 2023-11-12 ENCOUNTER — Ambulatory Visit: Payer: Medicaid Other | Attending: Obstetrics and Gynecology

## 2023-11-12 DIAGNOSIS — R102 Pelvic and perineal pain: Secondary | ICD-10-CM | POA: Diagnosis not present

## 2023-11-12 DIAGNOSIS — N941 Unspecified dyspareunia: Secondary | ICD-10-CM | POA: Diagnosis not present

## 2023-11-12 DIAGNOSIS — M6281 Muscle weakness (generalized): Secondary | ICD-10-CM | POA: Diagnosis present

## 2023-11-12 DIAGNOSIS — M62838 Other muscle spasm: Secondary | ICD-10-CM | POA: Diagnosis present

## 2023-11-12 DIAGNOSIS — R279 Unspecified lack of coordination: Secondary | ICD-10-CM | POA: Insufficient documentation

## 2023-11-12 DIAGNOSIS — R293 Abnormal posture: Secondary | ICD-10-CM | POA: Insufficient documentation
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
## 2023-11-25 ENCOUNTER — Ambulatory Visit: Payer: Self-pay

## 2023-11-30 ENCOUNTER — Ambulatory Visit

## 2023-12-14 ENCOUNTER — Encounter: Payer: Self-pay | Admitting: Family Medicine

## 2023-12-17 ENCOUNTER — Ambulatory Visit: Attending: Obstetrics and Gynecology

## 2023-12-17 DIAGNOSIS — R293 Abnormal posture: Secondary | ICD-10-CM | POA: Diagnosis present

## 2023-12-17 DIAGNOSIS — M6281 Muscle weakness (generalized): Secondary | ICD-10-CM | POA: Diagnosis present

## 2023-12-17 DIAGNOSIS — M62838 Other muscle spasm: Secondary | ICD-10-CM | POA: Insufficient documentation

## 2023-12-17 DIAGNOSIS — R279 Unspecified lack of coordination: Secondary | ICD-10-CM | POA: Diagnosis present

## 2023-12-17 NOTE — Patient Instructions (Signed)
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
## 2023-12-21 ENCOUNTER — Other Ambulatory Visit: Payer: Self-pay | Admitting: Medical Genetics

## 2023-12-25 ENCOUNTER — Other Ambulatory Visit (HOSPITAL_COMMUNITY)
Admission: RE | Admit: 2023-12-25 | Discharge: 2023-12-25 | Disposition: A | Discharge status: Home / Self Care | Payer: Self-pay | Source: Ambulatory Visit | Attending: Medical Genetics | Admitting: Medical Genetics

## 2023-12-28 ENCOUNTER — Encounter: Payer: Self-pay | Admitting: Family Medicine

## 2023-12-28 ENCOUNTER — Ambulatory Visit: Admitting: Family Medicine

## 2023-12-28 VITALS — BP 118/62 | HR 85 | Temp 98.6°F | Ht 60.0 in | Wt 113.0 lb

## 2023-12-28 DIAGNOSIS — J358 Other chronic diseases of tonsils and adenoids: Secondary | ICD-10-CM | POA: Diagnosis not present

## 2023-12-28 NOTE — Progress Notes (Signed)
 Subjective:    Patient ID: Kathleen Miles, female    DOB: [birth year], 23 y.o.   MRN: [id number]  Patient has 2 concerns.  First she complains of scratchy throat on the left-hand side.  She noticed something white on her tonsil and she was concerned.  Into the crypts on her left tonsil or tonsil stones.  1 is approximately 4 mm in diameter and is fairly large.  The other is small and less than 2 mm in diameter.  There is no lymphadenopathy in the neck and there is no erythema.  She denies any bad breath.  She denies any fevers or chills.  Second concern is acid reflux.  She states that she wakes up in the morning with an acid taste in her mouth and in her throat.  She takes omeprazole  in the morning 20 mg. No past medical history on file. No past surgical history on file. Current Outpatient Medications on File Prior to Visit  Medication Sig Dispense Refill   fluticasone  (FLONASE ) 50 MCG/ACT nasal spray Place 1 spray into both nostrils 2 (two) times daily. 16 g 2   lidocaine  (XYLOCAINE ) 2 % solution Use as directed 10 mLs in the mouth or throat every 3 (three) hours as needed. 100 mL 0   norgestrel -ethinyl estradiol  (CRYSELLE -28) 0.3-30 MG-MCG tablet Take 1 tablet by mouth daily. 28 tablet 11   omeprazole  (PRILOSEC) 20 MG capsule 1 cap(s) orally once a day for 30 days     cyclobenzaprine  (FLEXERIL ) 5 MG tablet Take 1 tablet (5 mg total) by mouth 3 (three) times daily as needed for muscle spasms. (Patient not taking: Reported on 12/28/2023) 20 tablet 0   diclofenac  (VOLTAREN ) 75 MG EC tablet Take 1 tablet (75 mg total) by mouth 2 (two) times daily. (Patient not taking: Reported on 12/28/2023) 30 tablet 0   Guaifenesin  1200 MG TB12 Take 1 tablet (1,200 mg total) by mouth in the morning and at bedtime. (Patient not taking: Reported on 12/28/2023) 14 tablet 0   oxyCODONE -acetaminophen  (PERCOCET/ROXICET) 5-325 MG tablet Take 1 tablet by mouth every 6 (six) hours as needed for severe pain (pain score  7-10). (Patient not taking: Reported on 12/28/2023) 6 tablet 0   promethazine -dextromethorphan (PROMETHAZINE -DM) 6.25-15 MG/5ML syrup Take 5 mLs by mouth at bedtime as needed for cough. (Patient not taking: Reported on 12/28/2023) 118 mL 0   sulfamethoxazole -trimethoprim  (BACTRIM  DS) 800-160 MG tablet Take 1 tablet by mouth 2 (two) times daily. (Patient not taking: Reported on 12/28/2023) 14 tablet 0   XIFAXAN 550 MG TABS tablet Take 550 mg by mouth 3 (three) times daily. (Patient not taking: Reported on 12/28/2023)     No current facility-administered medications on file prior to visit.     No Known Allergies     Review of Systems  Gastrointestinal:  Positive for abdominal pain.  All other systems reviewed and are negative.      Objective:   Physical Exam Vitals reviewed.  Constitutional:      General: She is not in acute distress.    Appearance: Normal appearance. She is not ill-appearing or toxic-appearing.  HENT:     Head: Normocephalic and atraumatic.     Mouth/Throat:   Cardiovascular:     Rate and Rhythm: Normal rate and regular rhythm.     Heart sounds: Normal heart sounds.  Pulmonary:     Effort: Pulmonary effort is normal. No respiratory distress.     Breath sounds: Normal breath sounds. No wheezing or rales.  Lymphadenopathy:     Cervical: No cervical adenopathy.  Neurological:     Mental Status: She is alert.           Assessment & Plan:  Tonsil stone Patient has tonsil stones.  I try to remove 1 using a tongue depressor but I was unsuccessful.  I offered the patient a referral to ENT to remove them but I reassured her that they are not dangerous.  At the present time, the patient decided just to watch them.  I recommended that she lift the head of her bed approximately 2 inches to help with reflux in the morning.  I recommended that she try to sleep on a slight incline rather than increase medication

## 2023-12-30 ENCOUNTER — Encounter

## 2024-01-05 LAB — GENECONNECT MOLECULAR SCREEN: Genetic Analysis Overall Interpretation: NEGATIVE

## 2024-01-06 ENCOUNTER — Ambulatory Visit

## 2024-01-08 ENCOUNTER — Ambulatory Visit
Admission: RE | Admit: 2024-01-08 | Discharge: 2024-01-08 | Disposition: A | Discharge status: Home / Self Care | Source: Ambulatory Visit | Attending: Family Medicine | Admitting: Family Medicine

## 2024-01-08 VITALS — BP 109/66 | HR 71 | Temp 98.2°F | Resp 16

## 2024-01-08 DIAGNOSIS — R109 Unspecified abdominal pain: Secondary | ICD-10-CM

## 2024-01-08 DIAGNOSIS — R350 Frequency of micturition: Secondary | ICD-10-CM | POA: Diagnosis present

## 2024-01-08 DIAGNOSIS — R300 Dysuria: Secondary | ICD-10-CM | POA: Diagnosis present

## 2024-01-08 LAB — POCT URINALYSIS DIP (MANUAL ENTRY)
Bilirubin, UA: NEGATIVE
Glucose, UA: NEGATIVE mg/dL
Ketones, POC UA: NEGATIVE mg/dL
Leukocytes, UA: NEGATIVE
Nitrite, UA: NEGATIVE
Protein Ur, POC: NEGATIVE mg/dL
Spec Grav, UA: >=1.03 — AB (ref 1.010–1.025)
Urobilinogen, UA: 1 U/dL
pH, UA: 6 (ref 5.0–8.0)

## 2024-01-08 NOTE — ED Provider Notes (Signed)
 [facility]    CSN: [id number] Arrival date & time: 01/08/24  0909      History   Chief Complaint Chief Complaint  Patient presents with   Abdominal Pain    Frequently being pre and and being uncomfortable peeing right side hurts - Entered by patient    HPI Kathleen Miles is a 23 y.o. female.   Presenting today with 5-day history of lower abdominal discomfort, dysuria, urinary frequency, occasional right lower abdominal and flank pain.  Denies fever, chills, nausea, vomiting, vaginal discharge or rashes, hematuria.  So far trying ibuprofen with minimal relief.  LMP 12/16/2023.  No concern for STIs or pregnancy.    History reviewed. No pertinent past medical history.  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 No pertinent surgical history.  OB History     Gravida  0   Para  0   Term  0   Preterm  0   AB  0   Living  0      SAB  0   IAB  0   Ectopic  0   Multiple  0   Live Births  0            Home Medications    Prior to Admission medications   Medication Sig Start Date End Date Taking? Authorizing Provider  cyclobenzaprine  (FLEXERIL ) 5 MG tablet Take 1 tablet (5 mg total) by mouth 3 (three) times daily as needed for muscle spasms. Patient not taking: Reported on 12/28/2023 10/05/23   Leath-Warren, Belen Bowers, NP  diclofenac  (VOLTAREN ) 75 MG EC tablet Take 1 tablet (75 mg total) by mouth 2 (two) times daily. Patient not taking: Reported on 12/28/2023 10/13/23   Austine Lefort, MD  fluticasone  (FLONASE ) 50 MCG/ACT nasal spray Place 1 spray into both nostrils 2 (two) times daily. 09/18/23   Corbin Dess, PA-C   Guaifenesin  1200 MG TB12 Take 1 tablet (1,200 mg total) by mouth in the morning and at bedtime. Patient not taking: Reported on 12/28/2023 11/07/23   Starlene Eaton, FNP  lidocaine  (XYLOCAINE ) 2 % solution Use as directed 10 mLs in the mouth or throat every 3 (three) hours as needed. 09/18/23   Corbin Dess, PA-C  norgestrel -ethinyl estradiol  (CRYSELLE -28) 0.3-30 MG-MCG tablet Take 1 tablet by mouth daily. 08/06/23   Austine Lefort, MD  omeprazole  (PRILOSEC) 20 MG capsule 1 cap(s) orally once a day for 30 days 09/03/23   [provider]  oxyCODONE -acetaminophen  (PERCOCET/ROXICET) 5-325 MG tablet Take 1 tablet by mouth every 6 (six) hours as needed for severe pain (pain score 7-10). Patient not taking: Reported on 12/28/2023 08/10/23   Rolinda Climes, DO  promethazine -dextromethorphan (PROMETHAZINE -DM) 6.25-15 MG/5ML syrup Take 5 mLs by mouth at bedtime as needed for cough. Patient not taking: Reported on 12/28/2023 11/07/23   Starlene Eaton, FNP  sulfamethoxazole -trimethoprim  (BACTRIM  DS) 800-160 MG tablet Take 1 tablet by mouth 2 (two) times daily. Patient not taking: Reported on 12/28/2023 10/13/23   Austine Lefort, MD  XIFAXAN 550 MG TABS tablet Take 550 mg by mouth 3 (three) times daily. Patient not taking: Reported on 12/28/2023 05/04/23  [provider]    Family History Family History  Problem Relation Age of Onset   HIV Father     Social History Social History   Tobacco Use   Smoking status: Never   Smokeless tobacco: Never  Vaping Use   Vaping status: Never Used  Substance Use Topics   Alcohol use: No   Drug use: No     Allergies   Patient has no known allergies.   Review of Systems Review of Systems PER HPI  Physical Exam Triage Vital Signs ED Triage Vitals [01/08/24 0929]  Encounter Vitals Group     BP 109/66     Girls Systolic BP Percentile      Girls Diastolic BP Percentile      Boys Systolic BP Percentile      Boys  Diastolic BP Percentile      Pulse Rate 71     Resp 16     Temp 98.2 F (36.8 C)     Temp Source Oral     SpO2 98 %     Weight      Height      Head Circumference      Peak Flow      Pain Score      Pain Loc      Pain Education      Exclude from Growth Chart    No data found.  Updated Vital Signs BP 109/66 (BP Location: Right Arm)   Pulse 71   Temp 98.2 F (36.8 C) (Oral)   Resp 16   LMP 12/16/2023   SpO2 98%   Visual Acuity Right Eye Distance:   Left Eye Distance:   Bilateral Distance:    Right Eye Near:   Left Eye Near:    Bilateral Near:     Physical Exam Vitals and nursing note reviewed.  Constitutional:      Appearance: Normal appearance. She is not ill-appearing.  HENT:     Head: Atraumatic.     Mouth/Throat:     Mouth: Mucous membranes are moist.   Eyes:     Extraocular Movements: Extraocular movements intact.     Conjunctiva/sclera: Conjunctivae normal.    Cardiovascular:     Rate and Rhythm: Normal rate and regular rhythm.     Heart sounds: Normal heart sounds.  Pulmonary:     Effort: Pulmonary effort is normal.     Breath sounds: Normal breath sounds.  Abdominal:     General: Abdomen is flat. There is no distension.     Palpations: Abdomen is soft.     Tenderness: There is abdominal tenderness. There is no right CVA tenderness, guarding or rebound.     Comments: Mild right lower quadrant tenderness to palpation without distention or guarding.  Negative McBurney's and Rovsing's  Genitourinary:    Comments: GU exam deferred, self swab performed  Musculoskeletal:        General: Normal range of motion.     Cervical back: Normal range of motion and neck supple.   Skin:    General: Skin is warm and dry.   Neurological:     Mental Status: She is alert and oriented to person, place, and time.   Psychiatric:        Mood and Affect: Mood normal.        Thought Content: Thought content normal.        Judgment: Judgment normal.      UC  Treatments / Results  Labs (all  labs ordered are listed, but only abnormal results are displayed) Labs Reviewed  POCT URINALYSIS DIP (MANUAL ENTRY) - Abnormal; Notable for the following components:      Result Value   Spec Grav, UA >=1.030 (*)    Blood, UA large (*)    All other components within normal limits  CERVICOVAGINAL ANCILLARY ONLY    EKG   Radiology No results found.  Procedures Procedures (including critical care time)  Medications Ordered in UC Medications - No data to display  Initial Impression / Assessment and Plan / UC Course  I have reviewed the triage vital signs and the nursing notes.  Pertinent labs & imaging results that were available during my care of the patient were reviewed by me and considered in my medical decision making (see chart for details).     Urinalysis today without evidence of urinary tract infection, vaginal swab pending for further evaluation.  She does have a history of ovarian cysts per patient, unclear if this is playing into her current symptoms so discussed follow-up with OB/GYN for further evaluation and consideration of imaging if not improving.  Final Clinical Impressions(s) / UC Diagnoses   Final diagnoses:  Urinary frequency  Dysuria  Right flank pain     Discharge Instructions      Your urine test did not show a urinary tract infection today.  We have sent out a vaginal swab for further evaluation.  Drink plenty of water, take ibuprofen and use heating pads as needed for pain relief.  Follow-up for worsening symptoms either with primary care or OB/GYN    ED Prescriptions   None    PDMP not reviewed this encounter.   Corbin Dess, [place] 01/08/24 1102

## 2024-01-08 NOTE — Discharge Instructions (Signed)
 Your urine test did not show a urinary tract infection today.  We have sent out a vaginal swab for further evaluation.  Drink plenty of water, take ibuprofen and use heating pads as needed for pain relief.  Follow-up for worsening symptoms either with primary care or OB/GYN

## 2024-01-08 NOTE — ED Triage Notes (Signed)
 Pt reports she has low abdominal pain, burning with urination, and frequent urination x5 days

## 2024-01-11 ENCOUNTER — Ambulatory Visit (HOSPITAL_COMMUNITY): Payer: Self-pay

## 2024-01-11 LAB — CERVICOVAGINAL ANCILLARY ONLY
Bacterial Vaginitis (gardnerella): NEGATIVE
Candida Glabrata: NEGATIVE
Candida Vaginitis: POSITIVE — AB
Comment: NEGATIVE
Comment: NEGATIVE
Comment: NEGATIVE

## 2024-01-11 MED ORDER — FLUCONAZOLE 150 MG PO TABS: 150.0000 mg | ORAL_TABLET | Freq: Once | ORAL | 0 refills | Status: AC

## 2024-01-12 ENCOUNTER — Ambulatory Visit
Admission: EM | Admit: 2024-01-12 | Discharge: 2024-01-12 | Disposition: A | Discharge status: Home / Self Care | Attending: Nurse Practitioner | Admitting: Nurse Practitioner

## 2024-01-12 DIAGNOSIS — T7840XA Allergy, unspecified, initial encounter: Secondary | ICD-10-CM

## 2024-01-12 DIAGNOSIS — R221 Localized swelling, mass and lump, neck: Secondary | ICD-10-CM

## 2024-01-12 MED ORDER — PREDNISONE 20 MG PO TABS: 40.0000 mg | ORAL_TABLET | Freq: Every day | ORAL | 0 refills | Status: AC

## 2024-01-12 MED ORDER — METHYLPREDNISOLONE SODIUM SUCC 125 MG IJ SOLR
80.0000 mg | Freq: Once | INTRAMUSCULAR | Status: AC
  Administered 2024-01-12: 80 mg via INTRAMUSCULAR

## 2024-01-12 NOTE — ED Provider Notes (Signed)
 [facility]    CSN: [id number] Arrival date & time: 01/12/24  7829      History   Chief Complaint No chief complaint on file.   HPI Kathleen Miles is a 23 y.o. female.   The history is provided by the patient.   Patient presents for complaints of a possible allergic reaction after taking fluconazole .  Patient states that she took fluconazole  1 day ago for a yeast infection.  She informed this provider that she had taken it before in the past without side effects.  She states approximately 1 to 2 hours after taking the medication, her throat began to become itchy and swollen.  She states that she had trouble swallowing and felt short of breath.  States that she did take Zyrtec with some relief of her symptoms.  States that her throat is now sore.  Denies fever, chills, wheezing, cough, shortness of breath, difficulty breathing, chest pain, tongue swelling, or lip swelling.  History reviewed. No pertinent past medical history.  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 No pertinent surgical history.  OB History     Gravida  0   Para  0   Term  0   Preterm  0   AB  0   Living  0      SAB  0   IAB  0   Ectopic  0   Multiple  0   Live Births  0            Home Medications    Prior to Admission medications   Medication Sig Start Date End Date Taking? Authorizing Provider  norgestrel -ethinyl estradiol  (CRYSELLE -28) 0.3-30 MG-MCG tablet Take 1 tablet by mouth daily. 08/06/23  Yes Austine Lefort, MD  omeprazole  (PRILOSEC) 20 MG capsule 1 cap(s) orally once a day for 30 days 09/03/23  Yes [provider]  predniSONE  (DELTASONE) 20 MG tablet Take 2 tablets (40 mg total) by mouth daily with breakfast for 5 days. 01/12/24 01/17/24 Yes Leath-Warren, Belen Bowers, NP  cyclobenzaprine  (FLEXERIL ) 5 MG tablet Take 1 tablet (5 mg total) by mouth 3 (three) times daily as needed for muscle spasms. Patient not taking: Reported on 12/28/2023 10/05/23   Leath-Warren, Belen Bowers, NP  diclofenac  (VOLTAREN ) 75 MG EC tablet Take 1 tablet (75 mg total) by mouth 2 (two) times daily. Patient not taking: Reported on 12/28/2023 10/13/23   Austine Lefort, MD  fluticasone  (FLONASE ) 50 MCG/ACT nasal spray Place 1 spray into both nostrils 2 (two) times daily. 09/18/23   Corbin Dess, PA-C  Guaifenesin  1200 MG TB12 Take 1 tablet (1,200 mg total) by mouth in the morning and at bedtime. Patient not taking: Reported on 12/28/2023 11/07/23   Starlene Eaton, FNP  lidocaine  (XYLOCAINE ) 2 % solution Use as directed 10 mLs in the mouth or throat every 3 (three) hours as needed. 09/18/23   Corbin Dess, PA-C  oxyCODONE -acetaminophen  (PERCOCET/ROXICET) 5-325 MG tablet Take 1 tablet by mouth every 6 (six) hours as needed for severe pain (pain score 7-10). Patient not taking: Reported on 12/28/2023 08/10/23   Rolinda Climes, DO  promethazine -dextromethorphan (PROMETHAZINE -DM) 6.25-15 MG/5ML  syrup Take 5 mLs by mouth at bedtime as needed for cough. Patient not taking: Reported on 12/28/2023 11/07/23   Starlene Eaton, FNP  sulfamethoxazole -trimethoprim  (BACTRIM  DS) 800-160 MG tablet Take 1 tablet by mouth 2 (two) times daily. Patient not taking: Reported on 12/28/2023 10/13/23   Austine Lefort, MD  XIFAXAN 550 MG TABS tablet Take 550 mg by mouth 3 (three) times daily. Patient not taking: Reported on 12/28/2023 05/04/23   [provider]    Family History Family History  Problem Relation Age of Onset   HIV Father     Social History Social History   Tobacco Use   Smoking status: Never   Smokeless tobacco: Never   Vaping Use   Vaping status: Never Used  Substance Use Topics   Alcohol use: No   Drug use: No     Allergies   Diflucan  [fluconazole ] and Pork-derived products   Review of Systems Review of Systems Per HPI  Physical Exam Triage Vital Signs ED Triage Vitals  Encounter Vitals Group     BP 01/12/24 0847 105/71     Girls Systolic BP Percentile --      Girls Diastolic BP Percentile --      Boys Systolic BP Percentile --      Boys Diastolic BP Percentile --      Pulse Rate 01/12/24 0847 74     Resp 01/12/24 0847 18     Temp 01/12/24 0847 98.3 F (36.8 C)     Temp Source 01/12/24 0847 Oral     SpO2 01/12/24 0847 97 %     Weight --      Height --      Head Circumference --      Peak Flow --      Pain Score 01/12/24 0844 7     Pain Loc --      Pain Education --      Exclude from Growth Chart --    No data found.  Updated Vital Signs BP 105/71 (BP Location: Right Arm)   Pulse 74   Temp 98.3 F (36.8 C) (Oral)   Resp 18   LMP 12/16/2023   SpO2 97%   Visual Acuity Right Eye Distance:   Left Eye Distance:   Bilateral Distance:    Right Eye Near:   Left Eye Near:    Bilateral Near:     Physical Exam Vitals and nursing note reviewed.  Constitutional:      General: She is not in acute distress.    Appearance: Normal appearance.  HENT:     Head: Normocephalic.     Right Ear: Tympanic membrane, ear canal and external ear normal.     Left Ear: Tympanic membrane, ear canal and external ear normal.     Nose: Nose normal.     Mouth/Throat:     Mouth: Mucous membranes are moist.     Pharynx: No posterior oropharyngeal erythema.     Comments: Airway is clear and patent, no obstruction present  Eyes:     Extraocular Movements: Extraocular movements intact.     Conjunctiva/sclera: Conjunctivae normal.     Pupils: Pupils are equal, round, and reactive to light.    Cardiovascular:     Rate and Rhythm: Normal rate and regular rhythm.     Pulses: Normal pulses.      Heart sounds: Normal heart sounds.  Pulmonary:     Effort: Pulmonary effort is normal. No respiratory distress.  Breath sounds: Normal breath sounds. No stridor. No wheezing, rhonchi or rales.  Abdominal:     Palpations: Abdomen is soft.     Tenderness: There is no abdominal tenderness.   Musculoskeletal:     Cervical back: Normal range of motion.   Skin:    General: Skin is warm and dry.   Neurological:     General: No focal deficit present.     Mental Status: She is alert and oriented to person, place, and time.   Psychiatric:        Mood and Affect: Mood normal.        Behavior: Behavior normal.      UC Treatments / Results  Labs (all labs ordered are listed, but only abnormal results are displayed) Labs Reviewed - No data to display  EKG   Radiology No results found.  Procedures Procedures (including critical care time)  Medications Ordered in UC Medications  methylPREDNISolone sodium succinate (SOLU-MEDROL) 125 mg/2 mL injection 80 mg (80 mg Intramuscular Given 01/12/24 0911)    Initial Impression / Assessment and Plan / UC Course  I have reviewed the triage vital signs and the nursing notes.  Pertinent labs & imaging results that were available during my care of the patient were reviewed by me and considered in my medical decision making (see chart for details).  On exam, lung sounds are clear throughout, room air sats at 97%.  Patient speaking in complete sentences, airway is clear and patent.  Solu-Medrol 80 mg IM administered for allergic reaction.  Will start patient on prednisone 40 mg for the next 5 days.  Supportive care recommendations were provided and discussed with the patient to include continuing antihistamines, and strict ER follow-up precautions. Patient was in agreement with this plan of care and verbalizes understanding. All questions were answered. Patient is stable for discharge.  Final Clinical Impressions(s) / UC Diagnoses   Final  diagnoses:  Allergic reaction to drug, initial encounter  Throat swelling     Discharge Instructions      You were given an injection of Solu-Medrol 80 mg today.  Start the prednisone on 01/13/2024. Increase fluids and allow for plenty of rest. You may continue Benadryl every 6 hours or cetirizine daily for the next 3 to 4 days. Go to the emergency department immediately if you experience shortness of breath, difficulty breathing, throat swelling, lip swelling, tongue swelling, wheezing, or other concerns. Follow-up as needed.      ED Prescriptions     Medication Sig Dispense Auth. Provider   predniSONE (DELTASONE) 20 MG tablet Take 2 tablets (40 mg total) by mouth daily with breakfast for 5 days. 10 tablet Leath-Warren, Belen Bowers, NP      PDMP not reviewed this encounter.   Hardy Lia, NP 01/12/24 1026

## 2024-01-12 NOTE — ED Triage Notes (Signed)
 Pt presents to UC for c/o possible allergic reaction starting yesterday. Pt states she took fluconazole  for the first time for yeast infection. Pt states throat began to be itchy and swell. Pt took zyrtec with minimal relief. Continues to have some itching and swelling.

## 2024-01-12 NOTE — Discharge Instructions (Signed)
 You were given an injection of Solu-Medrol 80 mg today.  Start the prednisone on 01/13/2024. Increase fluids and allow for plenty of rest. You may continue Benadryl every 6 hours or cetirizine daily for the next 3 to 4 days. Go to the emergency department immediately if you experience shortness of breath, difficulty breathing, throat swelling, lip swelling, tongue swelling, wheezing, or other concerns. Follow-up as needed.

## 2024-01-13 ENCOUNTER — Ambulatory Visit

## 2024-01-23 ENCOUNTER — Encounter: Payer: Self-pay | Admitting: Family Medicine

## 2024-01-27 ENCOUNTER — Ambulatory Visit

## 2024-02-04 ENCOUNTER — Ambulatory Visit: Admitting: Family Medicine

## 2024-02-04 ENCOUNTER — Encounter: Payer: Self-pay | Admitting: Family Medicine

## 2024-02-04 VITALS — BP 100/60 | HR 61 | Temp 98.8°F | Ht 60.0 in | Wt 114.4 lb

## 2024-02-04 DIAGNOSIS — H1013 Acute atopic conjunctivitis, bilateral: Secondary | ICD-10-CM | POA: Diagnosis not present

## 2024-02-04 DIAGNOSIS — H0288A Meibomian gland dysfunction right eye, upper and lower eyelids: Secondary | ICD-10-CM | POA: Diagnosis not present

## 2024-02-04 DIAGNOSIS — R197 Diarrhea, unspecified: Secondary | ICD-10-CM

## 2024-02-04 DIAGNOSIS — H0288B Meibomian gland dysfunction left eye, upper and lower eyelids: Secondary | ICD-10-CM

## 2024-02-04 MED ORDER — LEVOCETIRIZINE DIHYDROCHLORIDE 5 MG PO TABS: 5.0000 mg | ORAL_TABLET | Freq: Every evening | ORAL | 5 refills | Status: AC

## 2024-02-04 MED ORDER — OLOPATADINE HCL 0.2 % OP SOLN: 2.0000 [drp] | Freq: Every day | OPHTHALMIC | 11 refills | Status: AC

## 2024-02-04 NOTE — Progress Notes (Signed)
 Subjective:    Patient ID: Kathleen Miles, female    DOB: 2000-09-17, 23 y.o.   MRN: [id number]  Patient orts that he swollen eyes every day she states that is made worse when she is at work and surrounded by dust.  She reports itching around the lid margins.  She reports that her eyes are matted shut with yellow crust in the morning.  She states that her eyes feel dry and irritated.  She also reports rhinorrhea and sneezing.  She has tried over-the-counter eyedrops with minimal relief.  Today there is no erythema in her conjunctiva.  However her eyelids do appear slightly swollen.  There is some yellow debris matted in her eyelashes.  There is no erythema or warmth of the eyelids to suggest secondary infection.  The patient has no drainage or discharge.  She also states that she gets severe diarrhea every time she eats.  She believes she is allergic to something that she eats.  For instance, she has discovered the pork stomach.  She would like blood test to see if she has allergies to anything else. No past medical history on file. No past surgical history on file. Current Outpatient Medications on File Prior to Visit  Medication Sig Dispense Refill   lidocaine  (XYLOCAINE ) 2 % solution Use as directed 10 mLs in the mouth or throat every 3 (three) hours as needed. 100 mL 0   norgestrel -ethinyl estradiol  (CRYSELLE -28) 0.3-30 MG-MCG tablet Take 1 tablet by mouth daily. 28 tablet 11   omeprazole  (PRILOSEC) 20 MG capsule 1 cap(s) orally once a day for 30 days     fluticasone  (FLONASE ) 50 MCG/ACT nasal spray Place 1 spray into both nostrils 2 (two) times daily. 16 g 2   No current facility-administered medications on file prior to visit.     Allergies  Allergen Reactions   Diflucan  [Fluconazole ] Shortness Of Breath and Swelling   Pork-Derived Products Diarrhea       Review of Systems  Gastrointestinal:  Positive for abdominal pain.  All other systems reviewed and are negative.       Objective:   Physical Exam Vitals reviewed.  Constitutional:      General: She is not in acute distress.    Appearance: Normal appearance. She is not ill-appearing or toxic-appearing.  HENT:     Head: Normocephalic and atraumatic.  Eyes:     General: No allergic shiner or scleral icterus.       Right eye: No discharge or hordeolum.        Left eye: No discharge or hordeolum.     Conjunctiva/sclera:     Right eye: Right conjunctiva is not injected. No chemosis, exudate or hemorrhage.    Left eye: Left conjunctiva is not injected. No chemosis, exudate or hemorrhage. Cardiovascular:     Rate and Rhythm: Normal rate and regular rhythm.     Heart sounds: Normal heart sounds.  Pulmonary:     Effort: Pulmonary effort is normal. No respiratory distress.     Breath sounds: Normal breath sounds. No wheezing or rales.  Lymphadenopathy:     Cervical: No cervical adenopathy.  Neurological:     Mental Status: She is alert.           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  I recommended that she use Pataday  drops 2 drops in each eye daily coupled with xyzal  5 mg daily to help suppress allergies.  I recommended that she use baby shampoo and scrub her lid margins with her eyelashes gently each evening using the fingertips.  I will obtain lab work to evaluate for any food based allergies that may be upsetting her stomach

## 2024-02-05 LAB — ALLERGY PANEL 19, SEAFOOD GROUP
Allergen, Salmon, f41: <0.1 kU/L
CLASS: 0
CLASS: 0
CLASS: 0
CLASS: 0
CLASS: 0
Class: 0
Crab: <0.1 kU/L
Fish Cod: <0.1 kU/L
Lobster: <0.1 kU/L
Shrimp IgE: <0.1 kU/L
Tuna IgE: <0.1 kU/L

## 2024-02-05 LAB — ALLERGY PANEL 18, NUT MIX GROUP
Almonds: <0.1 kU/L
CLASS: 0
CLASS: 0
CLASS: 0
CLASS: 0
CLASS: 0
CLASS: 0
Cashew IgE: <0.1 kU/L
Class: 0
Coconut: <0.1 kU/L
Hazelnut: <0.1 kU/L
Peanut IgE: <0.1 kU/L
Pecan Nut: <0.1 kU/L
Sesame Seed f10: <0.1 kU/L

## 2024-02-05 LAB — ALLERGY PANEL 15, CEREAL GROUP
Allergen Barley f6: <0.1 kU/L
Allergen, Rice, f9: <0.1 kU/L
Allergen, Rye, f5: <0.1 kU/L
Buckwheat (f11) IgE: <0.1 kU/L
CLASS: 0
CLASS: 0
CLASS: 0
CLASS: 0
Class F11: 0
Gluten f79: <0.1 kU/L

## 2024-02-05 LAB — ALLERGY PANEL 16, VEGETABLE GROUP
Allergen Pea f12: <0.1 kU/L
Allergen, White Bean, f15: <0.1 kU/L
Allergen, White Potato,f35: <0.1 kU/L
CLASS: 0
CLASS: 0
Carrot: <0.1 kU/L
Class: 0
Class: 0
Corn: <0.1 kU/L
NR Class f15: 0

## 2024-02-05 LAB — ALLERGY PANEL, ANIMAL GROUP
Allergen, Chicken feather, e91: <0.1 kU/L
Allergen, Mouse Urine Protein, e78: <0.1 kU/L
Allergen,Goose feathers, e70: <0.1 kU/L
CLASS: 0
CLASS: 0
CLASS: 0
CLASS: 0
Class: 0
Cow Dander IgE: <0.1 kU/L
Horse dander: <0.1 kU/L

## 2024-02-05 LAB — INTERPRETATION:

## 2024-02-08 ENCOUNTER — Ambulatory Visit: Payer: Self-pay | Admitting: Family Medicine

## 2024-02-10 ENCOUNTER — Encounter

## 2024-02-15 ENCOUNTER — Ambulatory Visit: Admitting: Family Medicine

## 2024-02-24 ENCOUNTER — Encounter

## 2024-02-26 ENCOUNTER — Ambulatory Visit: Admitting: Family Medicine

## 2024-02-26 VITALS — BP 112/76 | HR 78 | Temp 97.9°F | Ht 63.0 in | Wt 112.4 lb

## 2024-02-26 DIAGNOSIS — S20212A Contusion of left front wall of thorax, initial encounter: Secondary | ICD-10-CM | POA: Diagnosis not present
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
## 2024-05-25 ENCOUNTER — Ambulatory Visit: Admission: EM | Admit: 2024-05-25 | Discharge: 2024-05-25 | Disposition: A | Discharge status: Home / Self Care

## 2024-05-25 ENCOUNTER — Encounter: Payer: Self-pay | Admitting: Emergency Medicine

## 2024-05-25 DIAGNOSIS — K529 Noninfective gastroenteritis and colitis, unspecified: Secondary | ICD-10-CM

## 2024-05-25 LAB — POCT RAPID STREP A (OFFICE): Rapid Strep A Screen: NEGATIVE

## 2024-05-25 LAB — POCT URINE PREGNANCY: Preg Test, Ur: NEGATIVE

## 2024-05-25 MED ORDER — SIMETHICONE 80 MG PO TABS: 1.0000 | ORAL_TABLET | Freq: Four times a day (QID) | ORAL | 0 refills | Status: AC | PRN

## 2024-05-25 MED ORDER — ONDANSETRON 4 MG PO TBDP: 4.0000 mg | ORAL_TABLET | Freq: Three times a day (TID) | ORAL | 0 refills | Status: AC | PRN

## 2024-05-25 MED ORDER — ONDANSETRON 4 MG PO TBDP
4.0000 mg | ORAL_TABLET | Freq: Once | ORAL | Status: AC
  Administered 2024-05-25: 4 mg via ORAL

## 2024-05-25 MED ORDER — LIDOCAINE VISCOUS HCL 2 % MT SOLN
15.0000 mL | Freq: Once | OROMUCOSAL | Status: AC
  Administered 2024-05-25: 15 mL via OROMUCOSAL

## 2024-05-25 MED ORDER — ALUM & MAG HYDROXIDE-SIMETH 200-200-20 MG/5ML PO SUSP
30.0000 mL | Freq: Once | ORAL | Status: AC
  Administered 2024-05-25: 30 mL via ORAL

## 2024-05-25 NOTE — ED Provider Notes (Signed)
 [facility]  Note:  This document was prepared using Dragon voice recognition software and may include unintentional dictation errors.  MRN: [id number] DOB: 2001/05/29  Subjective:   Kathleen Miles is a 23 y.o. female presenting for evaluation of epigastric abdominal pain, nausea, diarrhea x 3 to 4 days.  Patient reports that she first noticed symptoms on Sunday, believes that she may have eaten something that did not agree with her stomach.  Patient denies any vomiting but states she is having diarrhea and significant nausea.  Patient has tried over-the-counter Maalox with minimal improvement.  Patient also noticed today that her throat was swollen and painful and wanted evaluation for strep.  Patient denies any other persistent symptoms, no dysuria, urinary frequency, lower abdominal pain or pressure, flank pain, shortness of breath, chest pain.  No current facility-administered medications for this encounter.  Current Outpatient Medications:    ondansetron  (ZOFRAN -ODT) 4 MG disintegrating tablet, Take 1 tablet (4 mg total) by mouth every 8 (eight) hours as needed for nausea or vomiting., Disp: 20 tablet, Rfl: 0   Simethicone  80 MG TABS, Take 1 tablet (80 mg total) by mouth every 6 (six) hours as needed., Disp: 30 tablet, Rfl: 0   celecoxib (CELEBREX) 200 MG capsule, Take 200 mg by mouth 2 (two) times daily., Disp: , Rfl:    fluticasone  (FLONASE ) 50 MCG/ACT nasal spray, Place 1 spray into both nostrils 2 (two) times daily. (Patient not taking: Reported on 02/26/2024), Disp: 16 g, Rfl: 2   levocetirizine (XYZAL  ALLERGY  24HR) 5 MG tablet, Take 1 tablet (5 mg total) by mouth every evening., Disp: 30 tablet, Rfl: 5   lidocaine  (XYLOCAINE ) 2 % solution, Use as directed 10 mLs in the mouth or throat every 3 (three) hours as needed. (Patient not taking: Reported on 02/26/2024), Disp: 100 mL, Rfl: 0   methocarbamol (ROBAXIN) 750 MG tablet, Take 750 mg by mouth 4 (four) times  daily., Disp: , Rfl:    norgestrel -ethinyl estradiol  (CRYSELLE -28) 0.3-30 MG-MCG tablet, Take 1 tablet by mouth daily., Disp: 28 tablet, Rfl: 11   Olopatadine  HCl (PATADAY ) 0.2 % SOLN, Apply 2 drops to eye daily., Disp: 2.5 mL, Rfl: 11   omeprazole  (PRILOSEC) 20 MG capsule, 1 cap(s) orally once a day for 30 days, Disp: , Rfl:    Allergies  Allergen Reactions   Diflucan  [Fluconazole ] Shortness Of Breath and Swelling   Porcine (Pork) Protein-Containing Drug Products Diarrhea    History reviewed. No pertinent past medical history.   History reviewed. No pertinent surgical history.  Family History  Problem Relation Age of Onset   HIV Father     Social History   Tobacco Use   Smoking status: Never   Smokeless tobacco: Never  Vaping Use   Vaping status: Never Used  Substance Use Topics   Alcohol use: No   Drug use: No    ROS Refer to HPI for ROS details.  Objective:   Vitals: BP 110/74 (BP Location: Right Arm)   Pulse 78   Temp 98.2 F (36.8 C) (Oral)   Resp 17   LMP 04/05/2024 (Exact Date)   SpO2 97%   Physical Exam Vitals and nursing note reviewed.  Constitutional:      General: She is not in acute distress.    Appearance: She is well-developed. She is not ill-appearing or toxic-appearing.  HENT:     Head: Normocephalic and atraumatic.     Nose: Nose normal. No congestion or rhinorrhea.     Mouth/Throat:  Mouth: Mucous membranes are moist.     Pharynx: Oropharynx is clear. No oropharyngeal exudate or posterior oropharyngeal erythema.  Eyes:     Extraocular Movements: Extraocular movements intact.     Conjunctiva/sclera: Conjunctivae normal.  Cardiovascular:     Rate and Rhythm: Normal rate.  Pulmonary:     Effort: Pulmonary effort is normal. No respiratory distress.     Breath sounds: No stridor. No wheezing.  Abdominal:     General: There is no distension.     Palpations: Abdomen is soft.     Tenderness: There is abdominal tenderness in the  epigastric area. There is no right CVA tenderness or left CVA tenderness.  Skin:    General: Skin is warm and dry.  Neurological:     General: No focal deficit present.     Mental Status: She is alert and oriented to person, place, and time.  Psychiatric:        Mood and Affect: Mood normal.        Behavior: Behavior normal.     Procedures  Results for orders placed or performed during the hospital encounter of 05/25/24 (from the past 24 hours)  POCT rapid strep A     Status: None   Collection Time: 05/25/24  4:37 PM  Result Value Ref Range   Rapid Strep A Screen Negative Negative  POCT urine pregnancy     Status: None   Collection Time: 05/25/24  4:37 PM  Result Value Ref Range   Preg Test, Ur Negative Negative    No results found.   Assessment and Plan :     Discharge Instructions       1. Acute gastroenteritis (Primary) - POCT rapid strep A performed in UC is negative for strep pharyngitis - POCT urine pregnancy in UC is negative for pregnancy - ondansetron  (ZOFRAN -ODT) disintegrating tablet 4 mg given in UC for acute nausea - alum & mag hydroxide-simeth (MAALOX/MYLANTA) 200-200-20 MG/5ML suspension 30 mL & lidocaine  (XYLOCAINE ) 2 % viscous mouth solution 15 mL given in UC for acute gastritis - ondansetron  (ZOFRAN -ODT) 4 MG disintegrating tablet; Take 1 tablet (4 mg total) by mouth every 8 (eight) hours as needed for nausea or vomiting.  Dispense: 20 tablet; Refill: 0 - Simethicone  80 MG TABS; Take 1 tablet (80 mg total) by mouth every 6 (six) hours as needed.  Dispense: 30 tablet; Refill: 0 -Continue to monitor symptoms for any change in severity if there is any escalation of current symptoms or development of new symptoms follow-up in ER for further evaluation and management.      Aiana Nordquist B Benita Boonstra   Abrahm Mancia, [place], [place] 05/25/24 [phone]

## 2024-05-25 NOTE — ED Triage Notes (Signed)
 Pt states Sunday she developed upset stomach, nausea and diarrhea. Today she notice her throat is swollen red and painful.

## 2024-05-25 NOTE — Discharge Instructions (Signed)
  1. Acute gastroenteritis (Primary) - POCT rapid strep A performed in UC is negative for strep pharyngitis - POCT urine pregnancy in UC is negative for pregnancy - ondansetron  (ZOFRAN -ODT) disintegrating tablet 4 mg given in UC for acute nausea - alum & mag hydroxide-simeth (MAALOX/MYLANTA) 200-200-20 MG/5ML suspension 30 mL & lidocaine  (XYLOCAINE ) 2 % viscous mouth solution 15 mL given in UC for acute gastritis - ondansetron  (ZOFRAN -ODT) 4 MG disintegrating tablet; Take 1 tablet (4 mg total) by mouth every 8 (eight) hours as needed for nausea or vomiting.  Dispense: 20 tablet; Refill: 0 - Simethicone  80 MG TABS; Take 1 tablet (80 mg total) by mouth every 6 (six) hours as needed.  Dispense: 30 tablet; Refill: 0 -Continue to monitor symptoms for any change in severity if there is any escalation of current symptoms or development of new symptoms follow-up in ER for further evaluation and management.

## 2024-06-25 ENCOUNTER — Other Ambulatory Visit: Payer: Self-pay | Admitting: Family Medicine

## 2024-07-17 ENCOUNTER — Ambulatory Visit
Admission: EM | Admit: 2024-07-17 | Discharge: 2024-07-17 | Disposition: A | Discharge status: Home / Self Care | Attending: Family Medicine | Admitting: Family Medicine

## 2024-07-17 ENCOUNTER — Encounter: Payer: Self-pay | Admitting: Emergency Medicine

## 2024-07-17 DIAGNOSIS — K219 Gastro-esophageal reflux disease without esophagitis: Secondary | ICD-10-CM | POA: Diagnosis not present

## 2024-07-17 DIAGNOSIS — J029 Acute pharyngitis, unspecified: Secondary | ICD-10-CM | POA: Diagnosis not present

## 2024-07-17 HISTORY — DX: Gastro-esophageal reflux disease without esophagitis: K21.9

## 2024-07-17 LAB — POCT RAPID STREP A (OFFICE): Rapid Strep A Screen: NEGATIVE

## 2024-07-17 MED ORDER — FAMOTIDINE 20 MG PO TABS: 20.0000 mg | ORAL_TABLET | Freq: Two times a day (BID) | ORAL | 0 refills | Status: AC

## 2024-07-17 MED ORDER — LIDOCAINE VISCOUS HCL 2 % MT SOLN: 10.0000 mL | OROMUCOSAL | 0 refills | Status: AC | PRN

## 2024-07-17 NOTE — Discharge Instructions (Addendum)
 In addition to the prescribed medications, you may take 40 mg of your pantoprazole in the morning prior to eating or drinking anything.  Avoid spicy or acidic foods, do not eat right before bed and make sure to drink plenty of water.  Follow-up for worsening or unresolving symptoms.  Your strep test was negative today and I have sent out a throat culture to ensure no other bacterial growth.  We will let you know if anything comes back abnormal

## 2024-07-17 NOTE — ED Provider Notes (Signed)
 " [facility]    CSN: [id number] Arrival date & time: 07/17/24  1148      History   Chief Complaint No chief complaint on file.   HPI Kathleen Miles is a 23 y.o. female.    Patient presenting today with a sore throat and worsened acid reflux for the past week.  Denies fever, chills, congestion, cough, chest pain, shortness of breath, vomiting, diarrhea, new foods or medications.  Takes low-dose pantoprazole as needed for acid reflux, restarted the medication recently but has not felt much improvement with this.    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 No pertinent surgical history.  OB History     Gravida  0   Para  0   Term  0   Preterm  0   AB  0   Living  0      SAB  0   IAB  0   Ectopic  0   Multiple  0   Live Births  0            Home Medications    Prior to Admission medications  Medication Sig Start Date End Date Taking? Authorizing Provider  famotidine  (PEPCID ) 20 MG tablet Take 1 tablet (20 mg total) by mouth 2 (two) times daily. 07/17/24  Yes Stuart Vernell Norris, PA-C  lidocaine  (XYLOCAINE ) 2 % solution Use as directed 10 mLs in the mouth or throat every 3 (three) hours as needed. 07/17/24  Yes Stuart Vernell Norris, PA-C  CRYSELLE -28 0.3-30 MG-MCG tablet TAKE 1 TABLET BY MOUTH EVERY DAY 06/28/24   Duanne Butler DASEN, MD  fluticasone  (FLONASE ) 50 MCG/ACT nasal spray Place 1 spray into both nostrils 2 (two) times daily. Patient not taking: Reported on 02/26/2024 09/18/23   Stuart Vernell Norris, PA-C  levocetirizine (XYZAL  ALLERGY  24HR) 5 MG  tablet Take 1 tablet (5 mg total) by mouth every evening. 02/04/24   Duanne Butler DASEN, MD  lidocaine  (XYLOCAINE ) 2 % solution Use as directed 10 mLs in the mouth or throat every 3 (three) hours as needed. Patient not taking: Reported on 02/26/2024 09/18/23   Stuart Vernell Norris, PA-C  Olopatadine  HCl (PATADAY ) 0.2 % SOLN Apply 2 drops to eye daily. 02/04/24   Duanne Butler DASEN, MD  omeprazole  (PRILOSEC) 20 MG capsule 1 cap(s) orally once a day for 30 days 09/03/23   [provider]  ondansetron  (ZOFRAN -ODT) 4 MG disintegrating tablet Take 1 tablet (4 mg total) by mouth every 8 (eight) hours as needed for nausea or vomiting. 05/25/24   Reddick, Johnathan B, NP  Simethicone  80 MG TABS Take 1 tablet (80 mg total) by mouth every 6 (six) hours as needed. 05/25/24   Aurea Ethel NOVAK, NP    Family History Family History  Problem Relation Age of Onset   HIV Father     Social History Social History[1]   Allergies   Diflucan  [fluconazole ] and Porcine (pork) protein-containing drug products   Review of Systems Review of Systems Per HPI  Physical Exam Triage Vital Signs ED Triage Vitals  Encounter Vitals Group  BP 07/17/24 1241 126/74     Girls Systolic BP Percentile --      Girls Diastolic BP Percentile --      Boys Systolic BP Percentile --      Boys Diastolic BP Percentile --      Pulse Rate 07/17/24 1241 67     Resp 07/17/24 1241 18     Temp 07/17/24 1241 98.4 F (36.9 C)     Temp Source 07/17/24 1241 Oral     SpO2 07/17/24 1241 97 %     Weight --      Height --      Head Circumference --      Peak Flow --      Pain Score 07/17/24 1242 7     Pain Loc --      Pain Education --      Exclude from Growth Chart --    No data found.  Updated Vital Signs BP 126/74 (BP Location: Right Arm)   Pulse 67   Temp 98.4 F (36.9 C) (Oral)   Resp 18   LMP 07/02/2024 (Exact Date)   SpO2 97%   Visual Acuity Right Eye Distance:   Left Eye Distance:   Bilateral  Distance:    Right Eye Near:   Left Eye Near:    Bilateral Near:     Physical Exam Vitals and nursing note reviewed.  Constitutional:      Appearance: Normal appearance. She is not ill-appearing.  HENT:     Head: Atraumatic.     Nose: Nose normal.     Mouth/Throat:     Mouth: Mucous membranes are moist.     Pharynx: Oropharynx is clear. Posterior oropharyngeal erythema present. No oropharyngeal exudate.     Comments: No significant tonsillar edema, uvula midline, oral airway patent Eyes:     Extraocular Movements: Extraocular movements intact.     Conjunctiva/sclera: Conjunctivae normal.  Cardiovascular:     Rate and Rhythm: Normal rate and regular rhythm.     Heart sounds: Normal heart sounds.  Pulmonary:     Effort: Pulmonary effort is normal.     Breath sounds: Normal breath sounds.  Abdominal:     General: Bowel sounds are normal. There is no distension.     Palpations: Abdomen is soft.     Tenderness: There is no abdominal tenderness. There is no right CVA tenderness, left CVA tenderness or guarding.  Musculoskeletal:        General: Normal range of motion.     Cervical back: Normal range of motion and neck supple.  Lymphadenopathy:     Cervical: No cervical adenopathy.  Skin:    General: Skin is warm and dry.  Neurological:     Mental Status: She is alert and oriented to person, place, and time.  Psychiatric:        Mood and Affect: Mood normal.        Thought Content: Thought content normal.        Judgment: Judgment normal.      UC Treatments / Results  Labs (all labs ordered are listed, but only abnormal results are displayed) Labs Reviewed  POCT RAPID STREP A (OFFICE) - Normal  CULTURE, GROUP A STREP [facility])    EKG   Radiology No results found.  Procedures Procedures (including critical care time)  Medications Ordered in UC Medications - No data to display  Initial Impression / Assessment and Plan / UC Course  I have reviewed the triage  vital signs and the nursing notes.  Pertinent labs & imaging results that were available during my care of the patient were reviewed by me and considered in my medical decision making (see chart for details).     Vital signs and exam reassuring today, rapid strep negative, throat culture pending for further evaluation.  Possibly some inflammation from uncontrolled reflux causing her sore throat.  Increase the home Protonix to 40 mg daily, add Pepcid  twice daily as needed and viscous lidocaine  as needed.  Diet and lifestyle modifications reviewed.  Return for worsening or unresolving symptoms.  Final Clinical Impressions(s) / UC Diagnoses   Final diagnoses:  Sore throat  Gastroesophageal reflux disease, unspecified whether esophagitis present     Discharge Instructions      In addition to the prescribed medications, you may take 40 mg of your pantoprazole in the morning prior to eating or drinking anything.  Avoid spicy or acidic foods, do not eat right before bed and make sure to drink plenty of water.  Follow-up for worsening or unresolving symptoms.  Your strep test was negative today and I have sent out a throat culture to ensure no other bacterial growth.  We will let you know if anything comes back abnormal    ED Prescriptions     Medication Sig Dispense Auth. Provider   famotidine  (PEPCID ) 20 MG tablet Take 1 tablet (20 mg total) by mouth 2 (two) times daily. 30 tablet Stuart Vernell Norris, PA-C   lidocaine  (XYLOCAINE ) 2 % solution Use as directed 10 mLs in the mouth or throat every 3 (three) hours as needed. 100 mL Stuart Vernell Norris, [place]      PDMP not reviewed this encounter.    [1]  Social History Tobacco Use   Smoking status: Never   Smokeless tobacco: Never  Vaping Use   Vaping status: Never Used  Substance Use Topics   Alcohol use: No   Drug use: No     Stuart Vernell Norris, PA-C 07/17/24 1351  "

## 2024-07-17 NOTE — ED Triage Notes (Signed)
 Sore throat x 1 week.  States she has been having a lot of acid reflux x 1 week.  States throat feels swollen.

## 2024-07-20 ENCOUNTER — Ambulatory Visit (HOSPITAL_COMMUNITY): Payer: Self-pay

## 2024-07-20 LAB — CULTURE, GROUP A STREP (THRC)

## 2024-09-09 ENCOUNTER — Encounter: Admitting: Family Medicine
